# Patient Record
Sex: Male | Born: 1965 | Race: White | Hispanic: No | Marital: Single | Smoking: Former smoker
Health system: Southern US, Community
[De-identification: ages and names within clinical notes are randomized; demographics above are authoritative.]

## PROBLEM LIST (undated history)

## (undated) DIAGNOSIS — K219 Gastro-esophageal reflux disease without esophagitis: Secondary | ICD-10-CM

## (undated) DIAGNOSIS — N400 Enlarged prostate without lower urinary tract symptoms: Secondary | ICD-10-CM

## (undated) HISTORY — PX: CYSTOSCOPY WITH INSERTION OF UROLIFT: SHX6678

## (undated) HISTORY — DX: Gastro-esophageal reflux disease without esophagitis: K21.9

## (undated) HISTORY — DX: Benign prostatic hyperplasia without lower urinary tract symptoms: N40.0

## (undated) HISTORY — PX: NASAL SINUS SURGERY: SHX719

## (undated) HISTORY — PX: KNEE ARTHROSCOPY: SHX127

---

## 1984-05-01 HISTORY — PX: OTHER SURGICAL HISTORY: SHX169

## 2006-10-29 ENCOUNTER — Ambulatory Visit: Payer: Self-pay | Admitting: Internal Medicine

## 2007-02-11 ENCOUNTER — Ambulatory Visit (HOSPITAL_BASED_OUTPATIENT_CLINIC_OR_DEPARTMENT_OTHER): Admission: RE | Admit: 2007-02-11 | Discharge: 2007-02-11 | Payer: Self-pay | Admitting: Orthopedic Surgery

## 2007-07-22 DIAGNOSIS — B359 Dermatophytosis, unspecified: Secondary | ICD-10-CM | POA: Insufficient documentation

## 2007-08-15 DIAGNOSIS — F411 Generalized anxiety disorder: Secondary | ICD-10-CM | POA: Insufficient documentation

## 2007-08-30 DIAGNOSIS — K649 Unspecified hemorrhoids: Secondary | ICD-10-CM | POA: Insufficient documentation

## 2007-08-30 DIAGNOSIS — K449 Diaphragmatic hernia without obstruction or gangrene: Secondary | ICD-10-CM | POA: Insufficient documentation

## 2007-08-30 DIAGNOSIS — K219 Gastro-esophageal reflux disease without esophagitis: Secondary | ICD-10-CM | POA: Insufficient documentation

## 2008-05-01 HISTORY — PX: BLADDER SURGERY: SHX569

## 2008-06-11 ENCOUNTER — Ambulatory Visit (HOSPITAL_COMMUNITY): Admission: RE | Admit: 2008-06-11 | Discharge: 2008-06-12 | Payer: Self-pay | Admitting: Orthopedic Surgery

## 2008-07-27 ENCOUNTER — Encounter: Admission: RE | Admit: 2008-07-27 | Discharge: 2008-07-27 | Payer: Self-pay | Admitting: Orthopedic Surgery

## 2008-09-07 ENCOUNTER — Encounter: Admission: RE | Admit: 2008-09-07 | Discharge: 2008-09-07 | Payer: Self-pay | Admitting: Orthopedic Surgery

## 2008-11-16 ENCOUNTER — Encounter: Admission: RE | Admit: 2008-11-16 | Discharge: 2008-11-16 | Payer: Self-pay | Admitting: Orthopedic Surgery

## 2009-01-18 ENCOUNTER — Encounter: Admission: RE | Admit: 2009-01-18 | Discharge: 2009-01-18 | Payer: Self-pay | Admitting: Orthopedic Surgery

## 2009-06-07 ENCOUNTER — Encounter: Admission: RE | Admit: 2009-06-07 | Discharge: 2009-06-07 | Payer: Self-pay | Admitting: Orthopedic Surgery

## 2009-07-02 ENCOUNTER — Encounter: Payer: Self-pay | Admitting: Pulmonary Disease

## 2009-07-20 ENCOUNTER — Ambulatory Visit: Payer: Self-pay | Admitting: Pulmonary Disease

## 2009-07-20 DIAGNOSIS — J309 Allergic rhinitis, unspecified: Secondary | ICD-10-CM

## 2009-07-20 DIAGNOSIS — R0789 Other chest pain: Secondary | ICD-10-CM | POA: Insufficient documentation

## 2009-07-20 DIAGNOSIS — J3089 Other allergic rhinitis: Secondary | ICD-10-CM | POA: Insufficient documentation

## 2009-07-20 DIAGNOSIS — R0602 Shortness of breath: Secondary | ICD-10-CM | POA: Insufficient documentation

## 2009-07-29 ENCOUNTER — Ambulatory Visit: Payer: Self-pay | Admitting: Pulmonary Disease

## 2009-08-03 ENCOUNTER — Telehealth (INDEPENDENT_AMBULATORY_CARE_PROVIDER_SITE_OTHER): Payer: Self-pay | Admitting: *Deleted

## 2009-08-06 ENCOUNTER — Ambulatory Visit: Payer: Self-pay | Admitting: Pulmonary Disease

## 2009-08-17 ENCOUNTER — Telehealth (INDEPENDENT_AMBULATORY_CARE_PROVIDER_SITE_OTHER): Payer: Self-pay | Admitting: *Deleted

## 2009-09-07 ENCOUNTER — Telehealth: Payer: Self-pay | Admitting: Pulmonary Disease

## 2010-06-01 NOTE — Progress Notes (Signed)
Summary: Records request from the [facility]  Request for records received from the [facility]. Request forwarded to [facility]. Wilder Glade  August 17, 2009 3:37 PM

## 2010-06-01 NOTE — Miscellaneous (Signed)
Summary: Orders Update pft charges  Clinical Lists Changes  Orders: Added new Service order of Carbon Monoxide diffusing w/capacity (94720) - Signed Added new Service order of Lung Volumes (94240) - Signed Added new Service order of Spirometry (Pre & Post) (94060) - Signed 

## 2010-06-01 NOTE — Assessment & Plan Note (Signed)
Summary: ov to discuss PFT results/mg   Copy to:  Sigmund Hazel Primary Provider/Referring Provider:  Sigmund Hazel  CC:  Pt is here for a f/u appt to discuss PFT results.  Pt states he hasn't noticed changes in tightness in chest since increasing Prilosec to bid. Marland Kitchen  History of Present Illness: the pt comes in today for review of his pfts.  His spirometry shows minimal airflow obstruction, lung volumes are normal, and dlco is normal.  I have gone over the study in detail with the pt, and answered all of his questions.  Current Medications (verified): 1)  Prilosec 40 Mg Cpdr (Omeprazole) .... Take 1 Tablet By Mouth Two Times A Day 2)  Xalatan 0.005 % Soln (Latanoprost) .Marland Kitchen.. 1 Drop in Each Eye At Bedtime  Allergies (verified): 1)  ! Penicillin 2)  ! * Tetanus  Vital Signs:  Patient profile:   45 year old male Height:      72 inches Weight:      198.25 pounds BMI:     26.98 O2 Sat:      97 % on Room air Temp:     98.2 degrees F oral Pulse rate:   106 / minute BP sitting:   122 / 78  (left arm) Cuff size:   regular  Vitals Entered By: Arman Filter LPN (August 06, 1608 1:26 PM)  O2 Flow:  Room air CC: Pt is here for a f/u appt to discuss PFT results.  Pt states he hasn't noticed changes in tightness in chest since increasing Prilosec to bid.  Comments Medications reviewed with patient Arman Filter LPN  August 07, 9602 1:26 PM    Physical Exam  General:  wd male in nad   Impression & Recommendations:  Problem # 1:  DYSPNEA (ICD-786.05)  the pt has continued to have sob and chest tightness, and his pfts are really not overly impressive.  His FEV1% is minimally decreased, and may be indicative of occult asthma or RAD related to his job exposures.  What I do not know is if this translates into his SYMPTOMS of chest pressure and dyspnea.  I would like to try him on a bronchodilator/ICS combo for the next 3 weeks, and see if he has improvement.  He will call me with his progress.   Time spent with pt today was .  Medications Added to Medication List This Visit: 1)  Prilosec 40 Mg Cpdr (Omeprazole) .... Take 1 tablet by mouth two times a day  Other Orders: Est. Patient Level III (54098)  Patient Instructions: 1)  trial of symbicort 160/4.5  2 inhalations am and pm each day for next 3 weeks.  rinse mouth well after using 2)  call me in 3 weeks with response to therapy.

## 2010-06-01 NOTE — Progress Notes (Signed)
Summary: fyi  Phone Note Call from Patient   Caller: Patient Call For: clance Summary of Call: pt calling to report that symbicort isn't helping very much Initial call taken by: Rickard Patience,  Sep 07, 2009 2:39 PM  Follow-up for Phone Call        Spoke with Edward Nelson has been using symbicort 2 puffs twice a day for a month he was given two samples, and has approx 4 days left, fyi for you that it did not help. Also wanted you to know that he no longer has insurance Armita [employer], LPN  Sep 07, 2009 2:47 PM   Additional Follow-up for Phone Call Additional follow up Details #1::        a lack of response to therapy means that his very mild abnormality on breathing tests have nothing to do with his symptoms of chest pressure and sob.  I can find nothing pulmonary to explain his symptoms.  ok to stop all inhalers. Additional Follow-up by: Barbaraann Share MD,  Sep 08, 2009 8:38 PM    Additional Follow-up for Phone Call Additional follow up Details #2::    pt advised. Carron Curie CMA  Sep 09, 2009 9:16 AM

## 2010-06-01 NOTE — Assessment & Plan Note (Signed)
Summary: consult for dyspnea and chest discomfort   Copy to:  Sigmund Hazel Primary Provider/Referring Provider:  Sigmund Hazel  CC:  Pulmonary Consult.  History of Present Illness: The pt is a 45y/o male who I have been asked to see for atypical chest pain.  The pt feels that he has tightness in his chest at all times, and this has been ongoing for several months.  He describes the discomfort as a pressure-like sensation, and also notes abnormal sob.  He has doe at 2 blocks on flat ground at a moderate pace, and will also get winded walking up one flight of stairs.  He does note occasional cough, and can produce mucus with "black material" at times.  He works in Research scientist (physical sciences), primarily with copper, and has a lot of smoke and soot in his environment.  He has a long h/o tobacco use, but did quit 3 years ago.  He also has a h/o hiatal hernia with reflux, but is not overly symptomatic currently.  He did have a cxr recently which showed only mild scoliosis, but no acute process.  Preventive Screening-Counseling & Management  Alcohol-Tobacco     Smoking Status: quit  Current Medications (verified): 1)  Prilosec 40 Mg Cpdr (Omeprazole) .... Take 1 Tablet By Mouth Once A Day 2)  Xalatan 0.005 % Soln (Latanoprost) .Marland Kitchen.. 1 Drop in Each Eye At Bedtime  Allergies (verified): 1)  ! Penicillin 2)  ! * Tetanus  Past History:  Past Medical History:  ALLERGIC RHINITIS (ICD-477.9) HEMORRHOIDS (ICD-455.6) HIATAL HERNIA (ICD-553.3) GERD (ICD-530.81)    Past Surgical History: Hemorrhoid surgery sinus surgery June 2000 disc surgery in neck June 2010 Trans Urethral Incision of bladder  R shoulder surgery   Family History: Reviewed history and no changes required. cancer: mother (breast) father (oral)   Social History: Reviewed history and no changes required. Patient states former smoker.  started at age 74.  1 1/2 ppd.  quit 2008. pt is single. pt lives alone. pt works at [employer].    Smoking Status:  quit  Review of Systems       The patient complains of shortness of breath with activity, chest pain, acid heartburn, indigestion, nasal congestion/difficulty breathing through nose, and anxiety.  The patient denies shortness of breath at rest, productive cough, non-productive cough, coughing up blood, irregular heartbeats, loss of appetite, weight change, abdominal pain, difficulty swallowing, sore throat, tooth/dental problems, headaches, sneezing, itching, ear ache, depression, hand/feet swelling, joint stiffness or pain, rash, change in color of mucus, and fever.    Vital Signs:  Patient profile:   45 year old male Height:      72 inches Weight:      194.50 pounds BMI:     26.47 O2 Sat:      97 % on Room air Temp:     98.2 degrees F oral Pulse rate:   71 / minute BP sitting:   120 / 82  (left arm) Cuff size:   regular  Vitals Entered By: Arman Filter LPN (July 20, 2009 10:18 AM)  O2 Flow:  Room air CC: Pulmonary Consult Comments Medications reviewed with patient Arman Filter LPN  July 20, 2009 10:18 AM    Physical Exam  General:  wd male in nad Eyes:  PERRLA and EOMI.   Nose:  mild deviation of septum to left with narrowing Mouth:  moderate elongation of soft palate and uvula. Neck:  no jvd, tmg, LN Lungs:  clear to auscultation, no  wheezing or rhonchi Heart:  rrr, no mrg Abdomen:  soft, NT, BS+ Extremities:  no edema or cyanosis pulses intact distally Neurologic:  alert and oriented, moves all 4.   Impression & Recommendations:  Problem # 1:  DYSPNEA (ICD-786.05) It is unclear if the patient has a pulmonary issue that is causing both his sob and chest tightness.  He does have a h/o tobacco abuse and works in a Advertising copywriter, and therefore may have airways disease.  I think he needs to have full pfts for evaluation.   Problem # 2:  CHEST DISCOMFORT (ICD-786.59) Unclear if discomfort is due to airways disease, or if this may simply be a  manifestation of his reflux.  There is nothing to suggest cardiac disease since it is unrelenting, and has been going on for months.  It is not worsened with activity.  Medications Added to Medication List This Visit: 1)  Prilosec 40 Mg Cpdr (Omeprazole) .... Take 1 tablet by mouth once a day 2)  Xalatan 0.005 % Soln (Latanoprost) .Marland Kitchen.. 1 drop in each eye at bedtime  Other Orders: Consultation Level IV (04540) Pulmonary Referral (Pulmonary)  Patient Instructions: 1)  will schedule for breathing tests to evaluate for possible airways disease.  I will call you with results. 2)  would take reflux med am and pm for 3-4 weeks to see if chest tightness gets better.

## 2010-06-01 NOTE — Progress Notes (Signed)
Summary: results  Phone Note Call from Patient   Caller: Patient Call For: clance Summary of Call: results of pft Initial call taken by: Rickard Patience,  August 03, 2009 2:39 PM  Follow-up for Phone Call        pt calling for PFT results done 07-29-2009. pt aware kc out of the office today.   please call pt at [phone].  thanks.  Aundra Millet Reynolds LPN  August 04, 4538 3:23 PM   Additional Follow-up for Phone Call Additional follow up Details #1::        pt needs ov to go over pfts and discuss possible treatment. Additional Follow-up by: Barbaraann Share MD,  August 04, 2009 5:25 PM    Additional Follow-up for Phone Call Additional follow up Details #2::    called and spoke with pt.  pt scheduled to see [facility] friday 08-06-2009 at 1:30pm.  Arman Filter LPN  August 04, 9809 5:32 PM

## 2010-08-16 LAB — CBC
HCT: 45.4 % (ref 39.0–52.0)
Hemoglobin: 15.6 g/dL (ref 13.0–17.0)
MCV: 89.3 fL (ref 78.0–100.0)
WBC: 5.6 10*3/uL (ref 4.0–10.5)

## 2010-09-13 NOTE — Op Note (Signed)
NAME:  LADAINIAN, THERIEN                ACCOUNT NO.:  [id number]   MEDICAL RECORD NO.:  [id number]          PATIENT TYPE:  AMB   LOCATION:  DSC                          FACILITY:  [facility]   PHYSICIAN:  Feliberto Gottron. Turner Daniels, M.D.   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   PROCEDURE:  Right shoulder arthroscopic anterior-inferior acromioplasty,  formal distal clavicle excision.   SURGEON:  Feliberto Gottron. Turner Daniels, M.D.   FIRST ASSISTANT:  None.   ANESTHETIC:  Interscalene block with general endotracheal.   ESTIMATED BLOOD LOSS:  Minimal.   FLUID REPLACEMENT:  800 mL of crystalloid.   DRAINS PLACED:  None.   TOURNIQUET TIME:  None.   INDICATIONS FOR PROCEDURE:  The patient is a 45 year old man injured on  the job with MRI-proven AC joint arthritis confirmed by x-ray,  subacromial impingement with an intact rotator cuff.  He failed  conservative treatment with anti-inflammatory medicines, physical  therapy, got good temporary response to cortisone injections and is now  taken for arthroscopic evaluation and treatment of his shoulder.  Risks  and benefits of surgery were discussed with the patient and questions  answered.   DESCRIPTION OF PROCEDURE:  The patient was identified by armband and  underwent interscalene block anesthesia in the block area at [facility]  day surgery center.  He was then taken the operating room 1 where the  appropriate anesthetic monitors were reattached and general endotracheal  anesthesia was induced.  He was then placed in the beach-chair position,  right upper extremity prepped and draped in usual sterile fashion from  the wrist to the hemithorax, and we began the procedure  by making  standard portals 1.5-cm anterior to the [facility] joint lateral to the junction  of middle and posterior thirds of acromion, posterior to the  posterolateral corner of the acromion process.  The inflow was placed  anteriorly, the arthroscope laterally and a 4.2 [place]  shaver posteriorly allowing subacromial bursectomy and outlining of the  subacromial spur which was then removed with a 4.5 hooded vortex bur  creating a flat type 1 acromion.  At this point, the [facility] joint was  evaluated.  It was down to bare-bone arthritic changes and the distal  inferior 1 cm of the clavicle removed from this approach.  We then  swapped portals bringing the arthroscope posteriorly, the inflow  laterally and the bur anteriorly completing the distal clavicle  excision, and photographic documentation was made of the acromioplasty  and the distal clavicle excision as well as the intact although inflamed  rotator cuff.  The arthroscope was then repositioned into the  glenohumeral joint using a posterior portal where diagnostic arthroscopy  revealed a normal glenohumeral joint, normal biceps anchor rotator cuff  insertion, although there was some inflammation along the supraspinatus  insertion.  At this point, the shoulder  was irrigated out with normal  saline solution, the arthroscopic instruments removed and a dressing of  Xeroform, 4x4 dressing sponges, Hypafix tape and a sling applied.  The  patient was laid supine, awakened and taken to the recovery room without  difficulty.      Feliberto Gottron. Turner Daniels, M.D.  Electronically Signed     FJR/MEDQ  D:  02/11/2007  T:  02/12/2007  Job:  [id number]

## 2010-09-13 NOTE — Assessment & Plan Note (Signed)
[facility]                         GASTROENTEROLOGY OFFICE NOTE   NAME:Bumgarner, Pasha                         MRN:          [id number]  DATE:10/29/2006                            DOB:          [birth year]    Mr. Christopoulos is a 45 year old gentleman who is here today to evaluate his  gastroesophageal reflux.  He has an almost constant sour taste in his  mouth when he comes home from work in the mornings.  He has had a hiatal  hernia and reflux for at least five years.  His evaluation took place in  [place] in [place] by Dr. Bishop Limbo.  He underwent an upper  endoscopy and colonoscopy in 2003.  We have requested the records.  His  endoscopy showed hiatal hernia and colonoscopy, possibly a polyp.  He  tried Nexium and Protonix.  None of them were a success.  He is  currently on Prevacid 30 mg in the morning and Prilosec 20 mg at  bedtime.  He never takes both of those medications the same day.  He  either takes the Prevacid or the Prilosec.  He works third shift and has  a very irregular schedule.  The work starts at 7 p.m., and he gets home  at 7 a.m. for two days, then he is two days off.  Then he works again  and is again off for three days.  His eating habits as well as sleeping  habits are changed constantly.  His weight has increased since he  stopped smoking six months ago.  He has a history of hemorrhoids,  intermittent bright red blood per rectum.  He denies dysphagia or  odynophagia.  There is a family history of colon polyps in his mother.   MEDICATIONS:  1. Prilosec OTC.  2. Prevacid Solutabs 30 mg p.r.n.   PAST MEDICAL HISTORY:  Anxiety.  Hemorrhoid surgery.   FAMILY HISTORY:  Positive for colon polyps, breast cancer in mother.   SOCIAL HISTORY:  Single.  High school education. Works as a Radio broadcast assistant.  He does not smoke anymore, he drinks occasionally.   REVIEW OF SYSTEMS:  Vitals stable.  Sinus problems.  Excessive thirst.  Hearing problems.   PHYSICAL EXAMINATION:  VITAL SIGNS:  Blood pressure 114/76, pulse 80.  Weight 181 pounds.  GENERAL:  He is alert, oriented, in no distress.  SKIN:  Warm and dry.  There were no tattoos.  HEENT:  Sclerae are anicteric.  NECK:  Supple with no adenopathy.  LUNGS:  Clear to auscultation.  COR:  Normal S1 and S2.  ABDOMEN:  Soft and nontender with normoactive bowel sounds __________  costal margin.  No CVA tenderness.  RECTAL:  Normal rectal tone.  Stool is hemoccult negative.   IMPRESSION:  A 45 year old white male with longstanding gastroesophageal  reflux, only partially responsive to proton pump inhibitor.  The  contributing factor in his symptoms is his irregular schedule, working  third shift intermittently.  It impacts his eating habits as well as his  gastric emptying.  We need to rule out Barrett's esophagus.  There is  also a family history of colon polyps which needs to be further followed  with another colonoscopy.  Patient was asking about the possibility of  Nissen fundoplication.  We have discussed the surgery as a possible  option if he does not respond to medical treatment.   PLAN:  1. Obtain records from Dr. Orlan Leavens from [place] with reports of      upper endoscopy and colonoscopy.  2. Start on a regular basis Prilosec 40 mg twice daily.  3. Antireflux measures.  4. Based on the results and review of the records, we will probably      likely need another upper endoscopy with biopsies as well as      colonoscopy.  He will follow antireflux measures as much as he can      with regards to his work schedule.  He will return in 6-8 weeks.     Hedwig Morton. Juanda Chance, MD  Electronically Signed    DMB/MedQ  DD: 10/29/2006  DT: 10/30/2006  Job #: [id number]

## 2010-09-13 NOTE — H&P (Signed)
NAME:  Edward Nelson, Edward Nelson                ACCOUNT NO.:  [id number]   MEDICAL RECORD NO.:  [id number]         PATIENT TYPE:  COIB   LOCATION:                               FACILITY:  [facility]   PHYSICIAN:  Alvy Beal, MD    DATE OF BIRTH:  Feb 18, 1966   DATE OF ADMISSION:  06/11/2008  DATE OF DISCHARGE:                              HISTORY & PHYSICAL   CHIEF COMPLAINT:  Neck and left-sided arm pain.   BRIEF HISTORY:  Edward Nelson is a very pleasant 45 year old gentleman who  is well known to our practice.  Despite physical therapy and  conservative management, he is diving significant radicular left arm  pain, numbness, and neck pain.  His strength remains reduced on the left  triceps and with the pain and dysesthesia down the left side are  significantly impairing his quality of life and also increasing his  pain.  Since the patient has a failed conservative management, Dr.  Shon Baton recommended an anterior cervical diskectomy and fusion at the C6-  7 level.  Dr. Shon Baton discussed the risks and benefits of the procedure.  The risks include infection, bleeding, nerve damage, death, stroke,  paralysis, failure to heal, need for further surgical intervention,  ongoing or worse pain, nonunion, temporary or permanent throat pain,  swallowing difficulties, and hoarseness in the voice.  Dr. Shon Baton took  the time to go over the surgical procedure and all of the patient's  questions were answered and the patient consented the procedure.   PAST MEDICAL/SURGICAL HISTORY:  The patient had a right ORIF of his  ankle in 1986.  He had a hemorrhoidectomy in 2005.  He had a TUBIN in  2007 and he had a right shoulder scope in 2008.   FAMILY HISTORY:  Father deceased at 25 of oral cancer, otherwise  negative.   SOCIAL HISTORY:  The patient currently is a nonsmoker; however, he does  have a history of using cigarettes in the past.  He occasionally enjoys  a glass of alcohol.  He has never used any street  drugs.  He currently  lives alone in a one-level apartment.   REVIEW OF SYSTEMS:  Review of systems taken today in the office.  GENERAL:  Otherwise, negative for fevers, chills, weight change, loss of  memory, night sweats, or fatigue.  HEENT:  Otherwise, negative for  headache, blurred vision, double vision, dizziness, hearing loss,  paralysis, weakness, tremor, blackout spells, insomnia, balance problem,  dentures, or ringing in the ears.  DERMATOLOGIC:  Positive for an  occasional rash that he gets under his right shoulder.  Otherwise,  negative for itching, hives, lesions, eczema, or any rashes near the  planned surgical site.  RESPIRATORY:  Otherwise, negative for shortness  of breath, cough, wheezing, or allergies.  CARDIOVASCULAR:  Otherwise,  negative for chest pains, palpitations, difficulty breathing laying  flat, swelling, and/or murmur.  GI:  Otherwise, negative for nausea,  vomiting, diarrhea, constipation, blood in stool, jaundice, heartburn,  difficulty swallowing, loss of appetite, and/or abdominal pain.  GU:  Negative for painful urination, urinary frequency, blood in  urine,  incontinence, urinary discharge, weak stream, urinating at night, flank  pain, urgency, or urinary retention.  MUSCULOSKELETAL:  Positive for  painful neck range of motion and positive for numbness in the left hand,  otherwise negative for any joint swelling, back pain, spasms, or morning  stiffness.   PHYSICAL EXAMINATION:  GENERAL:  The patient is alert and oriented x3,  very healthy, 45 year old gentleman in no acute distress.  Affect is  appropriate.  VITAL SIGNS:  Today in office included a blood pressure of 120/70, a  pulse of 72, and respiratory rate of 16.  SKIN:  Negative for any rashes or bruises.  HEENT:  Head was atraumatic and normocephalic.  Eyes, PERRLA.  Throat  was supple with no lymphadenopathy.  NECK:  No neck masses were noted.  Again, has some decreased range of  motion.  No  trachea deviation.  HEART:  Regular rate and rhythm.  No murmurs, rubs, or gallops.  LUNGS:  Clear to auscultation.  No wheezes, crackles, or vocal fremitus  was noted.  ABDOMEN:  Soft and nontender.  No rebound tenderness, masses, or  guarding.  No CVA tenderness.  MUSCULOSKELETAL:  Again, the patient had some weakness in the left  triceps muscle region.  Grip strength is approximately 5/5 compared  bilaterally.  He had a decreased sensation on the left extremity  compared to the right upper extremity.   ASSESSMENT/PLAN:  The patient is expected to undergo the planned  procedure of an anterior cervical diskectomy and fusion at C6-7 level by  Dr. Venita Lick on June 11, 2008.      Crissie Reese, PA      Alvy Beal, MD  Electronically Signed    AC/MEDQ  D:  06/04/2008  T:  06/05/2008  Job:  [id number]   cc:   [facility]

## 2010-09-13 NOTE — Op Note (Signed)
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   OPERATIVE PROCEDURE:  Anterior cervical diskectomy and fusion at C6-7  with an 8-mm fibular precut MTF graft packed with Actifuse and a 16-mm  anterior cervical plate affixed with 16-mm locking screws.   COMPLICATIONS:  None.   CONDITION:  Stable.   HISTORY:  This is a very pleasant 45 year old gentleman who has had long-  standing significant neck and right arm pain.  The patient states that  the pain and weakness are quite severe.  Despite appropriate  conservative management, he failed to improve and so we elected to  proceed with surgery.  All appropriate risks, benefits and alternatives  were discussed with the patient and consent was obtained.   OPERATIVE NOTE:  The patient was brought to the operating room and  placed supine on the operating table.  After successful induction of  general anesthesia and endotracheal intubation, TED and SCDs were  applied.  Rolled towels were placed between the shoulder blades and the  shoulders were taped down.  The anterior cervical spine was prepped and  draped in a standard fashion.   A standard left-sided Smith-Robinson approach to the cervical spine was  taken.  A longitudinal incision was made just at the level of the  cricoid cartilage and sharp dissection was carried out down to and  through the platysma.  I then sharply dissected through the deep  cervical fascia and I then was able to sweep the trachea and esophagus  medially and protected with a finger.  I visualized and protected the  carotid sheath with fingers underneath on the lateral side and I was  able to visualize the anterior cervical spine.  I then used a Forensic scientist to mobilize the prevertebral fascia.  I then placed a needle  into the C6-7 disk space, took an x-ray and confirmed that I was at the  appropriate level.  Once this was confirmed, I used a double-action  Leksell rongeur to remove the anterior osteophytes at the C6-7 level.  I  then placed self-retaining retractors blades (Caspar into the wounds,  deflated the endotracheal cuff, and ensured that the blades themselves  were underneath the longus colli muscle).  I then opened them up and  then inflated the endotracheal cuff.  At this point, I had excellent  visualization of the disk space and anterior cervical spine.  Distraction pins were placed into the C6-C7 disk vertebral bodies and  then began the diskectomy.   Using a 15 blade scalpel, I incised the annulus and then used a  pituitary rongeur, curettes,  and Kerrison rongeurs to resect the entire  disk material.  There were two small fragments of disk material that had  herniated into the posterolateral gutter on the left side which I could  freely remove using a nerve hook and micro pituitary rongeur.  I then  developed a plane using a fine curette between the underlying posterior  longitudinal ligament and the thecal sac.  I then used a 1-mm Kerrison  to resect the entire posterior longitudinal ligament.  I then resected  the underlying bone spurs at the uncovertebral joints and I swept the  left lateral gutter posterolaterally on the left to ensure that I had  removed all the fragments of disk material.  At this point, I started  with the diskectomy.  I used a rasp to decorticate the endplates and  then hydrated with an 8-mm lordotic MT fibular strut.  I then packed it  with Actifuse and malleted it to appropriate  depth.  I then placed a 16-  mm anterior cervical plate and affixed it with 16-mm locking screws.  X-  rays were satisfactory.  I then removed the remaining retractors and  rechecked to ensure that the esophagus did not become entrapped  underneath the plate.  I then returned the trachea and esophagus to  midline, irrigated copiously with normal saline, obtained hemostasis  using bipolar electrocautery and maintained it with FloSeal and then  closed the platysma with interrupted 2-0 Vicryl sutures and the skin  with 3-0 Monocryl.  Steri-Strips, dry dressing and Aspen collar were  applied.  The patient was extubated and transferred to the PACU without  incident .  At the end of the case, all needle and sponge counts were  correct.   FIRST ASSISTANT:  Crissie Reese, PA      Alvy Beal, MD  Electronically Signed     DDB/MEDQ  D:  06/11/2008  T:  06/12/2008  Job:  [id number]

## 2011-05-02 HISTORY — PX: CERVICAL DISC SURGERY: SHX588

## 2015-08-20 DIAGNOSIS — N411 Chronic prostatitis: Secondary | ICD-10-CM | POA: Insufficient documentation

## 2015-08-20 DIAGNOSIS — N529 Male erectile dysfunction, unspecified: Secondary | ICD-10-CM | POA: Insufficient documentation

## 2018-01-30 DIAGNOSIS — N410 Acute prostatitis: Secondary | ICD-10-CM | POA: Diagnosis not present

## 2018-01-30 DIAGNOSIS — R3914 Feeling of incomplete bladder emptying: Secondary | ICD-10-CM | POA: Diagnosis not present

## 2018-01-30 DIAGNOSIS — N401 Enlarged prostate with lower urinary tract symptoms: Secondary | ICD-10-CM | POA: Diagnosis not present

## 2018-01-30 DIAGNOSIS — R300 Dysuria: Secondary | ICD-10-CM | POA: Diagnosis not present

## 2018-03-12 DIAGNOSIS — R079 Chest pain, unspecified: Secondary | ICD-10-CM | POA: Diagnosis not present

## 2018-03-12 DIAGNOSIS — Z981 Arthrodesis status: Secondary | ICD-10-CM | POA: Diagnosis not present

## 2018-03-12 DIAGNOSIS — J309 Allergic rhinitis, unspecified: Secondary | ICD-10-CM | POA: Diagnosis not present

## 2018-03-12 DIAGNOSIS — L720 Epidermal cyst: Secondary | ICD-10-CM | POA: Diagnosis not present

## 2018-03-12 DIAGNOSIS — R600 Localized edema: Secondary | ICD-10-CM | POA: Diagnosis not present

## 2018-03-12 DIAGNOSIS — R0781 Pleurodynia: Secondary | ICD-10-CM | POA: Diagnosis not present

## 2018-03-14 DIAGNOSIS — N401 Enlarged prostate with lower urinary tract symptoms: Secondary | ICD-10-CM | POA: Diagnosis not present

## 2018-03-14 DIAGNOSIS — R339 Retention of urine, unspecified: Secondary | ICD-10-CM | POA: Diagnosis not present

## 2018-03-14 DIAGNOSIS — R3914 Feeling of incomplete bladder emptying: Secondary | ICD-10-CM | POA: Diagnosis not present

## 2018-04-04 DIAGNOSIS — L309 Dermatitis, unspecified: Secondary | ICD-10-CM | POA: Diagnosis not present

## 2018-04-04 DIAGNOSIS — K219 Gastro-esophageal reflux disease without esophagitis: Secondary | ICD-10-CM | POA: Diagnosis not present

## 2018-04-26 DIAGNOSIS — M2342 Loose body in knee, left knee: Secondary | ICD-10-CM | POA: Insufficient documentation

## 2018-05-21 DIAGNOSIS — L720 Epidermal cyst: Secondary | ICD-10-CM | POA: Diagnosis not present

## 2018-05-21 DIAGNOSIS — L723 Sebaceous cyst: Secondary | ICD-10-CM | POA: Diagnosis not present

## 2018-05-21 DIAGNOSIS — L309 Dermatitis, unspecified: Secondary | ICD-10-CM | POA: Diagnosis not present

## 2018-06-05 DIAGNOSIS — I2129 ST elevation (STEMI) myocardial infarction involving other sites: Secondary | ICD-10-CM | POA: Diagnosis not present

## 2018-06-05 DIAGNOSIS — R079 Chest pain, unspecified: Secondary | ICD-10-CM | POA: Diagnosis not present

## 2018-07-17 DIAGNOSIS — L309 Dermatitis, unspecified: Secondary | ICD-10-CM | POA: Diagnosis not present

## 2018-07-17 DIAGNOSIS — L821 Other seborrheic keratosis: Secondary | ICD-10-CM | POA: Diagnosis not present

## 2018-07-17 DIAGNOSIS — T148XXA Other injury of unspecified body region, initial encounter: Secondary | ICD-10-CM | POA: Diagnosis not present

## 2018-07-17 DIAGNOSIS — I872 Venous insufficiency (chronic) (peripheral): Secondary | ICD-10-CM | POA: Diagnosis not present

## 2018-07-31 DIAGNOSIS — L989 Disorder of the skin and subcutaneous tissue, unspecified: Secondary | ICD-10-CM | POA: Diagnosis not present

## 2018-07-31 DIAGNOSIS — L720 Epidermal cyst: Secondary | ICD-10-CM | POA: Diagnosis not present

## 2018-07-31 DIAGNOSIS — L723 Sebaceous cyst: Secondary | ICD-10-CM | POA: Diagnosis not present

## 2019-05-15 ENCOUNTER — Other Ambulatory Visit: Payer: Self-pay

## 2019-05-16 ENCOUNTER — Ambulatory Visit (INDEPENDENT_AMBULATORY_CARE_PROVIDER_SITE_OTHER): Payer: BC Managed Care – PPO | Admitting: Family Medicine

## 2019-05-16 ENCOUNTER — Encounter: Payer: Self-pay | Admitting: Family Medicine

## 2019-05-16 VITALS — BP 124/80 | HR 93 | Temp 98.5°F | Ht 72.0 in | Wt 200.9 lb

## 2019-05-16 DIAGNOSIS — Z13220 Encounter for screening for lipoid disorders: Secondary | ICD-10-CM | POA: Diagnosis not present

## 2019-05-16 DIAGNOSIS — K219 Gastro-esophageal reflux disease without esophagitis: Secondary | ICD-10-CM | POA: Diagnosis not present

## 2019-05-16 DIAGNOSIS — J309 Allergic rhinitis, unspecified: Secondary | ICD-10-CM

## 2019-05-16 DIAGNOSIS — K635 Polyp of colon: Secondary | ICD-10-CM | POA: Diagnosis not present

## 2019-05-16 DIAGNOSIS — Z131 Encounter for screening for diabetes mellitus: Secondary | ICD-10-CM

## 2019-05-16 LAB — COMPREHENSIVE METABOLIC PANEL
ALT: 19 U/L (ref 0–53)
AST: 16 U/L (ref 0–37)
Albumin: 4.3 g/dL (ref 3.5–5.2)
Alkaline Phosphatase: 76 U/L (ref 39–117)
BUN: 11 mg/dL (ref 6–23)
CO2: 27 mEq/L (ref 19–32)
Calcium: 9.6 mg/dL (ref 8.4–10.5)
Chloride: 104 mEq/L (ref 96–112)
Creatinine, Ser: 0.98 mg/dL (ref 0.40–1.50)
GFR: 79.34 mL/min (ref >60.00)
Glucose, Bld: 91 mg/dL (ref 70–99)
Potassium: 4 mEq/L (ref 3.5–5.1)
Sodium: 139 mEq/L (ref 135–145)
Total Bilirubin: 0.5 mg/dL (ref 0.2–1.2)
Total Protein: 6.5 g/dL (ref 6.0–8.3)

## 2019-05-16 LAB — CBC WITH DIFFERENTIAL/PLATELET
Basophils Absolute: 0 10*3/uL (ref 0.0–0.1)
Basophils Relative: 0.6 % (ref 0.0–3.0)
Eosinophils Absolute: 0.2 10*3/uL (ref 0.0–0.7)
Eosinophils Relative: 2.4 % (ref 0.0–5.0)
HCT: 49.6 % (ref 39.0–52.0)
Hemoglobin: 16.6 g/dL (ref 13.0–17.0)
Lymphocytes Relative: 28.4 % (ref 12.0–46.0)
Lymphs Abs: 2.1 10*3/uL (ref 0.7–4.0)
MCHC: 33.6 g/dL (ref 30.0–36.0)
MCV: 91.3 fl (ref 78.0–100.0)
Monocytes Absolute: 0.7 10*3/uL (ref 0.1–1.0)
Monocytes Relative: 9.8 % (ref 3.0–12.0)
Neutro Abs: 4.4 10*3/uL (ref 1.4–7.7)
Neutrophils Relative %: 58.8 % (ref 43.0–77.0)
Platelets: 164 10*3/uL (ref 150.0–400.0)
RBC: 5.43 Mil/uL (ref 4.22–5.81)
RDW: 13.6 % (ref 11.5–15.5)
WBC: 7.5 10*3/uL (ref 4.0–10.5)

## 2019-05-16 LAB — LIPID PANEL
Cholesterol: 181 mg/dL (ref 0–200)
HDL: 41.3 mg/dL (ref >39.00)
LDL Cholesterol: 108 mg/dL — ABNORMAL HIGH (ref 0–99)
NonHDL: 140.06
Total CHOL/HDL Ratio: 4
Triglycerides: 158 mg/dL — ABNORMAL HIGH (ref 0.0–149.0)
VLDL: 31.6 mg/dL (ref 0.0–40.0)

## 2019-05-16 LAB — HEMOGLOBIN A1C: Hgb A1c MFr Bld: 5.5 % (ref 4.6–6.5)

## 2019-05-16 MED ORDER — FLUTICASONE PROPIONATE 50 MCG/ACT NA SUSP: 2.0000 | Freq: Every day | NASAL | 6 refills | Status: DC

## 2019-05-16 NOTE — Progress Notes (Signed)
Edward Nelson DOB: Jun 28, 1965 Encounter date: 05/16/2019  This is a 55 y.o. male who presents to establish care. Chief Complaint  Patient presents with  . Establish Care    History of present illness: Was seeing provider through [facility] Marland KitchenBayard Beaver) and wanted to see MD.   Sinuses bothered him for years. Has had sinus surgery in past. Had left side operated on in past and then had some bleeding on right side. Does have issues at night with breathing. Was taking tylenol cold medication; now using equate tylenol, phenylephrine, guaifenesin. Just has a lot of drainage, eyes watering, drainage at night. Has nasal spray which he uses every once in awhile. Doesn't like taste of nasal spray; tends to go down throat. Has tried allegra, zyrtec, singulair. Currently not on antihistamine. Didn't feel like surgery helped with breathing on left side. Pretty much always has some sort of issue. Cough drop helps bring up phlegm to help with swallowing.  Does snore at night. Works night shift (just switched back). Tries to get 7-8 hours/night.   Acid reflux - has had this for years. Everyone in family also has this. Omeprazole does help with symptoms.   Had colonoscopy in 2006 in [place]; had some polyps. Due for repeat. Would like to wait until COVID is better.   Occasional neck pain - hx of replacement in 2013.   Past Medical History:  Diagnosis Date  . Enlarged prostate    treated by urologist with [facility]  . GERD (gastroesophageal reflux disease)    Past Surgical History:  Procedure Laterality Date  . BLADDER SURGERY    . CERVICAL DISC SURGERY    . KNEE ARTHROSCOPY Left   . NASAL SINUS SURGERY Left    started bleeding so didn't complete right side   Allergies  Allergen Reactions  . Penicillins   . Tetanus Toxoid    Current Meds  Medication Sig  . Multiple Vitamin (MULTIVITAMIN) capsule Take 1 capsule by mouth daily.  Marland Kitchen omeprazole (PRILOSEC) 20 MG capsule TAKE ONE  CAPSULE BY MOUTH ONCE DAILY  . tadalafil (CIALIS) 5 MG tablet TAKE ONE TABLET BY MOUTH ONCE DAILY   Social History   Tobacco Use  . Smoking status: Former Smoker    Packs/day: 1.00    Years: 35.00    Pack years: 35.00    Types: Cigarettes    Start date: 20    Quit date: 2015    Years since quitting: 6.0  . Smokeless tobacco: Never Used  Substance Use Topics  . Alcohol use: Not on file   Family History  Problem Relation Age of Onset  . Breast cancer Mother   . Osteoporosis Mother   . Hypertension Mother   . Arthritis Mother   . Cancer - Other Father        oral  . Breast cancer Sister   . Hypertension Sister   . Other Brother        acid reflux     Review of Systems  Constitutional: Negative for chills, fatigue and fever.  HENT: Positive for congestion and postnasal drip.   Respiratory: Negative for cough, chest tightness, shortness of breath and wheezing.   Cardiovascular: Negative for chest pain, palpitations and leg swelling.    Objective:  BP 124/80 (BP Location: Right Arm, Patient Position: Sitting, Cuff Size: Large)   Pulse 93   Temp 98.5 F (36.9 C) (Temporal)   Ht 6' (1.829 m)   Wt 200 lb 14.4 oz (  91.1 kg)   SpO2 98%   BMI 27.25 kg/m   Weight: 200 lb 14.4 oz (91.1 kg)   BP Readings from Last 3 Encounters:  05/16/19 124/80   Wt Readings from Last 3 Encounters:  05/16/19 200 lb 14.4 oz (91.1 kg)    Physical Exam Constitutional:      General: He is not in acute distress.    Appearance: He is well-developed.  HENT:     Right Ear: Tympanic membrane and ear canal normal.     Left Ear: Tympanic membrane and ear canal normal.     Nose: Septal deviation present.     Right Turbinates: Enlarged and swollen.     Left Turbinates: Enlarged.     Comments: There is significant turbinate and mucosal edema and erythema on the right.  Left is also erythematous, with loss of edema. Cardiovascular:     Rate and Rhythm: Normal rate and regular rhythm.      Heart sounds: Normal heart sounds. No murmur. No friction rub.  Pulmonary:     Effort: Pulmonary effort is normal. No respiratory distress.     Breath sounds: Normal breath sounds. No wheezing or rales.  Musculoskeletal:     Right lower leg: No edema.     Left lower leg: No edema.  Neurological:     Mental Status: He is alert and oriented to person, place, and time.  Psychiatric:        Behavior: Behavior normal.     Assessment/Plan: 1. Gastroesophageal reflux disease without esophagitis History of reflux that is well controlled with omeprazole. - Ambulatory referral to Gastroenterology  2. Polyp of colon, unspecified part of colon, unspecified type History of polyp in the colon overdue for repeat colonoscopy.  3. Allergic rhinitis, unspecified seasonality, unspecified trigger Willing to visit with allergist to see if there is something that can be done from an allergy standpoint to help with symptoms.  Encouraged him in the meanwhile to try an antihistamine daily as well as Flonase daily. - fluticasone (FLONASE) 50 MCG/ACT nasal spray; Place 2 sprays into both nostrils daily.  Dispense: 16 g; Refill: 6 - Ambulatory referral to Allergy - CBC with Differential/Platelet; Future - CBC with Differential/Platelet  4. Lipid screening - Lipid panel; Future - Lipid panel  5. Screening for diabetes mellitus - Comprehensive metabolic panel; Future - Hemoglobin A1c; Future - Hemoglobin A1c - Comprehensive metabolic panel  Return for pending labs. Can schedule physical in the near future. Micheline Rough, MD

## 2019-05-19 ENCOUNTER — Encounter: Payer: Self-pay | Admitting: Family Medicine

## 2019-05-19 ENCOUNTER — Encounter: Payer: Self-pay | Admitting: Gastroenterology

## 2019-05-20 ENCOUNTER — Encounter: Payer: Self-pay | Admitting: Family Medicine

## 2019-05-21 NOTE — Telephone Encounter (Signed)
Okay to put in any 30-minute time slot available.

## 2019-05-22 NOTE — Telephone Encounter (Signed)
Spoke with the pt and informed him an appt will be scheduled for 2/22 to arrive at 10:15am for a physical.  Message sent to [facility] as this is blocked as a same day slot.

## 2019-06-16 ENCOUNTER — Encounter: Payer: Self-pay | Admitting: Allergy

## 2019-06-16 ENCOUNTER — Ambulatory Visit: Payer: BC Managed Care – PPO | Admitting: Allergy

## 2019-06-16 ENCOUNTER — Ambulatory Visit: Payer: Self-pay | Admitting: Allergy and Immunology

## 2019-06-16 ENCOUNTER — Other Ambulatory Visit: Payer: Self-pay

## 2019-06-16 VITALS — BP 130/78 | HR 91 | Temp 98.6°F | Resp 16 | Ht 73.5 in | Wt 195.2 lb

## 2019-06-16 DIAGNOSIS — J3089 Other allergic rhinitis: Secondary | ICD-10-CM

## 2019-06-16 DIAGNOSIS — R12 Heartburn: Secondary | ICD-10-CM | POA: Diagnosis not present

## 2019-06-16 DIAGNOSIS — Z87891 Personal history of nicotine dependence: Secondary | ICD-10-CM | POA: Insufficient documentation

## 2019-06-16 DIAGNOSIS — L282 Other prurigo: Secondary | ICD-10-CM | POA: Diagnosis not present

## 2019-06-16 DIAGNOSIS — Z889 Allergy status to unspecified drugs, medicaments and biological substances status: Secondary | ICD-10-CM

## 2019-06-16 MED ORDER — AZELASTINE-FLUTICASONE 137-50 MCG/ACT NA SUSP: 1.0000 | Freq: Two times a day (BID) | NASAL | 5 refills | Status: DC

## 2019-06-16 NOTE — Assessment & Plan Note (Signed)
Perennial rhinitis symptoms for the past 15 years with worsening in the spring and when laying down. Tried zyrtec, allegra and Flonase with some benefit. Sinus surgery many years ago. Takes omeprazole for reflux.  Today's skin testing showed: Positive to weed, trees, mold, cockroach and dust mites.  Start environmental control measures.   Start saline irrigations 1-2 times a day to help with the drainage.  Start dymista (fluticasone + azelastine nasal spray combination) 1 spray per nostril twice a day.  This replaces Flonase. If it's not covered let us know.  May use over the counter antihistamines such as Zyrtec (cetirizine), Claritin (loratadine), Allegra (fexofenadine), or Xyzal (levocetirizine) daily as needed.  Read about allergy injections.

## 2019-06-16 NOTE — Patient Instructions (Addendum)
Today's skin testing showed:  Positive to weed, trees, mold, cockroach and dust mites.  Start environmental control measures.   Start saline irrigations 1-2 times a day to help with the drainage.  Start dymista (fluticasone + azelastine nasal spray combination) 1 spray per nostril twice a day.  This replaces Flonase. If it's not covered let us know.  May use over the counter antihistamines such as Zyrtec (cetirizine), Claritin (loratadine), Allegra (fexofenadine), or Xyzal (levocetirizine) daily as needed.  Read about allergy injections.   Reflux:  Continue omeprazole 20mg  once a day in the morning. Nothing to eat or drink for 30 minutes afterwards.  See below for reflux diet.   Rash:  If not getting better, recommend follow up with dermatology for possible skin biopsy.  See below for proper skin care.   Follow up in 2 months or sooner if needed.   Reducing Pollen Exposure . Pollen seasons: trees (spring), grass (summer) and ragweed/weeds (fall). Marland Kitchen Keep windows closed in your home and car to lower pollen exposure.  Susa Simmonds air conditioning in the bedroom and throughout the house if possible.  . Avoid going out in dry windy days - especially early morning. . Pollen counts are highest between 5 - 10 AM and on dry, hot and windy days.  . Save outside activities for late afternoon or after a heavy rain, when pollen levels are lower.  . Avoid mowing of grass if you have grass pollen allergy. Marland Kitchen Be aware that pollen can also be transported indoors on people and pets.  . Dry your clothes in an automatic dryer rather than hanging them outside where they might collect pollen.  . Rinse hair and eyes before bedtime. Control of House Dust Mite Allergen . Dust mite allergens are a common trigger of allergy and asthma symptoms. While they can be found throughout the house, these microscopic creatures thrive in warm, humid environments such as bedding, upholstered furniture and  carpeting. . Because so much time is spent in the bedroom, it is essential to reduce mite levels there.  . Encase pillows, mattresses, and box springs in special allergen-proof fabric covers or airtight, zippered plastic covers.  . Bedding should be washed weekly in hot water (130 F) and dried in a hot dryer. Allergen-proof covers are available for comforters and pillows that can't be regularly washed.  Wendee Copp the allergy-proof covers every few months. Minimize clutter in the bedroom. Keep pets out of the bedroom.  Marland Kitchen Keep humidity less than 50% by using a dehumidifier or air conditioning. You can buy a humidity measuring device called a hygrometer to monitor this.  . If possible, replace carpets with hardwood, linoleum, or washable area rugs. If that's not possible, vacuum frequently with a vacuum that has a HEPA filter. . Remove all upholstered furniture and non-washable window drapes from the bedroom. . Remove all non-washable stuffed toys from the bedroom.  Wash stuffed toys weekly. Cockroach Allergen Avoidance Cockroaches are often found in the homes of densely populated urban areas, schools or commercial buildings, but these creatures can lurk almost anywhere. This does not mean that you have a dirty house or living area. . Block all areas where roaches can enter the home. This includes crevices, wall cracks and windows.  . Cockroaches need water to survive, so fix and seal all leaky faucets and pipes. Have an exterminator go through the house when your family and pets are gone to eliminate any remaining roaches. Marland Kitchen Keep food in lidded containers and  put pet food dishes away after your pets are done eating. Vacuum and sweep the floor after meals, and take out garbage and recyclables. Use lidded garbage containers in the kitchen. Wash dishes immediately after use and clean under stoves, refrigerators or toasters where crumbs can accumulate. Wipe off the stove and other kitchen surfaces and cupboards  regularly. Mold Control . Mold and fungi can grow on a variety of surfaces provided certain temperature and moisture conditions exist.  . Outdoor molds grow on plants, decaying vegetation and soil. The major outdoor mold, Alternaria and Cladosporium, are found in very high numbers during hot and dry conditions. Generally, a late summer - fall peak is seen for common outdoor fungal spores. Rain will temporarily lower outdoor mold spore count, but counts rise rapidly when the rainy period ends. . The most important indoor molds are Aspergillus and Penicillium. Dark, humid and poorly ventilated basements are ideal sites for mold growth. The next most common sites of mold growth are the bathroom and the kitchen. Outdoor (Seasonal) Mold Control . Use air conditioning and keep windows closed. . Avoid exposure to decaying vegetation. Marland Kitchen Avoid leaf raking. . Avoid grain handling. . Consider wearing a face mask if working in moldy areas.  Indoor (Perennial) Mold Control  . Maintain humidity below 50%. . Get rid of mold growth on hard surfaces with water, detergent and, if necessary, 5% bleach (do not mix with other cleaners). Then dry the area completely. If mold covers an area more than 10 square feet, consider hiring an indoor environmental professional. . For clothing, washing with soap and water is best. If moldy items cannot be cleaned and dried, throw them away. . Remove sources e.g. contaminated carpets. . Repair and seal leaking roofs or pipes. Using dehumidifiers in damp basements may be helpful, but empty the water and clean units regularly to prevent mildew from forming. All rooms, especially basements, bathrooms and kitchens, require ventilation and cleaning to deter mold and mildew growth. Avoid carpeting on concrete or damp floors, and storing items in damp areas.   Buffered Isotonic Saline Irrigations:  Goal: . When you irrigate with the isotonic saline (salt water) it washes mucous and  other debris from your nose that could be contributing to your nasal symptoms.   Recipe: Marland Kitchen Obtain 1 quart jar that is clean . Fill with clean (bottled, boiled or distilled) water . Add 1-2 heaping teaspoons of salt without iodine o If the solution with 2 teaspoons of salt is too strong, adjust the amount down until better tolerated . Add 1 teaspoon of [employer] baking soda (pure bicarbonate) . Mix ingredients together and store at room temperature and discard after 1 week * Alternatively you can buy pre made salt packets for the [employer] bottle or there          are other over the counter brands available  Instructions: . Warm  cup of the solution in the microwave if desired but be careful not to overheat as this will burn the inside of your nose . Stand over a sink (or do it while you shower) and squirt the solution into one side of your nose aiming towards the back of your head o Sometimes saying "coca cola" while irrigating can be helpful to prevent fluid from going down your throat  . The solution will travel to the back of your nose and then come out the other side . Perform this again on the other side . Try to do  this twice a day . If you are using a nasal spray in addition to the irrigation, irrigate first and then use the topical nasal spray otherwise you will wash the nasal spray out of your nose   Skin care recommendations  Bath time: . Always use lukewarm water. AVOID very hot or cold water. Marland Kitchen Keep bathing time to 5-10 minutes. . Do NOT use bubble bath. . Use a mild soap and use just enough to wash the dirty areas. . Do NOT scrub skin vigorously.  . After bathing, pat dry your skin with a towel. Do NOT rub or scrub the skin.  Moisturizers and prescriptions:  . ALWAYS apply moisturizers immediately after bathing (within 3 minutes). This helps to lock-in moisture. . Use the moisturizer several times a day over the whole body. Kermit Balo summer moisturizers include: Aveeno,  CeraVe, Cetaphil. Kermit Balo winter moisturizers include: Aquaphor, Vaseline, Cerave, Cetaphil, Eucerin, Vanicream. . When using moisturizers along with medications, the moisturizer should be applied about one hour after applying the medication to prevent diluting effect of the medication or moisturize around where you applied the medications. When not using medications, the moisturizer can be continued twice daily as maintenance.  Laundry and clothing: . Avoid laundry products with added color or perfumes. . Use unscented hypo-allergenic laundry products such as Tide free, Cheer free & gentle, and All free and clear.  . If the skin still seems dry or sensitive, you can try double-rinsing the clothes. . Avoid tight or scratchy clothing such as wool. . Do not use fabric softeners or dyer sheets.   Heartburn Heartburn is a type of pain or discomfort that can happen in the throat or chest. It is often described as a burning pain. It may also cause a bad, acid-like taste in the mouth. Heartburn may feel worse when you lie down or bend over. It may be worse at night. It may be caused by stomach contents that move back up (reflux) into the tube that connects the mouth with the stomach (esophagus). Follow these instructions at home: Eating and drinking   Avoid certain foods and drinks as told by your doctor. This may include: ? Coffee and tea (with or without caffeine). ? Drinks that have alcohol. ? Energy drinks and sports drinks. ? Carbonated drinks or sodas. ? Chocolate and cocoa. ? Peppermint and mint flavorings. ? Garlic and onions. ? Horseradish. ? Spicy and acidic foods, such as:  Peppers.  Chili powder and curry powder.  Vinegar.  Hot sauces and BBQ sauce. ? Citrus fruit juices and citrus fruits, such as:  Oranges.  Lemons.  Limes. ? Tomato-based foods, such as:  Red sauce and pizza with red sauce.  Chili.  Salsa. ? Fried and fatty foods, such as:  Donuts.  [place]  fries and potato chips.  High-fat dressings. ? High-fat meats, such as:  Hot dogs and sausage.  Rib eye steak.  Ham and bacon. ? High-fat dairy items, such as:  Whole milk.  Butter.  Cream cheese.  Eat small meals often. Avoid eating large meals.  Avoid drinking large amounts of liquid with your meals.  Avoid eating meals during the 2-3 hours before bedtime.  Avoid lying down right after you eat.  Do not exercise right after you eat. Lifestyle      If you are overweight, lose an amount of weight that is healthy for you. Ask your doctor about a safe weight loss goal.  Do not use any products that contain  nicotine or tobacco, including cigarettes, e-cigarettes, and chewing tobacco. These can make your symptoms worse. If you need help quitting, ask your doctor.  Wear loose clothes. Do not wear anything tight around your waist.  Raise (elevate) the head of your bed about 6 inches (15 cm) when you sleep.  Try to lower your stress. If you need help doing this, ask your doctor. General instructions  Pay attention to any changes in your symptoms.  Take over-the-counter and prescription medicines only as told by your doctor. ? Do not take aspirin, ibuprofen, or other NSAIDs unless your doctor says it is okay. ? Stop medicines only as told by your doctor.  Keep all follow-up visits as told by your doctor. This is important. Contact a doctor if:  You have new symptoms.  You lose weight and you do not know why it is happening.  You have trouble swallowing, or it hurts to swallow.  You have wheezing or a cough that keeps happening.  Your symptoms do not get better with treatment.  You have heartburn often for more than 2 weeks. Get help right away if:  You have pain in your arms, neck, jaw, teeth, or back.  You feel sweaty, dizzy, or light-headed.  You have chest pain or shortness of breath.  You throw up (vomit) and your throw up looks like blood or coffee  grounds.  Your poop (stool) is bloody or black. These symptoms may represent a serious problem that is an emergency. Do not wait to see if the symptoms will go away. Get medical help right away. Call your local emergency services (911 in the [place] Do not drive yourself to the hospital. Summary  Heartburn is a type of pain that can happen in the throat or chest. It can feel like a burning pain. It may also cause a bad, acid-like taste in the mouth.  You may need to avoid certain foods and drinks to help your symptoms. Ask your doctor what foods and drinks you should avoid.  Take over-the-counter and prescription medicines only as told by your doctor. Do not take aspirin, ibuprofen, or other NSAIDs unless your doctor told you to do so.  Contact your doctor if your symptoms do not get better or they get worse. This information is not intended to replace advice given to you by your health care provider. Make sure you discuss any questions you have with your health care provider. Document Revised: 09/17/2017 Document Reviewed: 09/17/2017 Elsevier Patient Education  [place].

## 2019-06-16 NOTE — Assessment & Plan Note (Signed)
Reactions to penicillin and tetanus toxoid as a child. Not sure of exact events.  Consider penicillin testing in future. Meanwhile continue to avoid.

## 2019-06-16 NOTE — Assessment & Plan Note (Signed)
   Continue omeprazole 20mg  once a day in the morning. Nothing to eat or drink for 30 minutes afterwards.  Gave handout on reflux diet.

## 2019-06-16 NOTE — Assessment & Plan Note (Signed)
Pruritic rash started a few years ago. No triggers. Dermatology evaluation was unremarkable but tested positive for propylene glycol on patch testing. Uses halobetasol with some benefit.  Discussed with patient that I'm not sure what's the cause of his rash.  If not getting better, recommend follow up with dermatology for possible skin biopsy.  Gave handout on proper skin care.

## 2019-06-16 NOTE — Progress Notes (Signed)
New Patient Note  RE: Edward Nelson MRN: [id number] DOB: February 25, 1966 Date of Office Visit: 06/16/2019  Referring provider: Caren Macadam, MD Primary care provider: Caren Macadam, MD  Chief Complaint: Allergic Rhinitis  (drainage all the time, feels like choking), Allergy Testing, and Rash (red bumps on arms and legs that are itchy)  History of Present Illness: I had the pleasure of seeing Edward Nelson for initial evaluation at the [facility] on 06/16/2019. He is a 54 y.o. male, who is referred here by Caren Macadam, MD for the evaluation of rhinitis.  Rhinitis: He reports symptoms of PND, rhinorrhea, watery eyes, nasal congestion. Symptoms have been going on for 15 years. The symptoms are present all year around with worsening in spring and worse when laying down. Other triggers include exposure to unknown. Anosmia: no. Headache: rarely. He has used Human resources officer, zyrtec, Flonase 1 spray BID with some improvement in symptoms. Sinus infections: no. Previous work up includes: none. Previous ENT evaluation: had sinus surgery many years ago with no benefit. Previous sinus imaging: not recently. History of nasal polyps: no. History of reflux: takes omeprazole daily.  Rash: Rash started about a few years ago and persistent. Mainly occurs on his arms and legs. Describes them as little bumps which is pruritic. Individual rashes lasts about a few days. No ecchymosis upon resolution. Associated symptoms include: none. Suspected triggers are unknown but worse with cold weather. Denies any fevers, chills, changes in medications, foods, personal care products or recent infections. He has tried the following therapies: halobetasol with some benefit.  Previous work up includes: saw dermatology and had patch testing - ? Contact dermatitis but positive to propylene glycol.  Patient not sure if he had the rash biopsied.  Assessment and Plan: Edward Nelson is a 54 y.o. male  with: Other allergic rhinitis Perennial rhinitis symptoms for the past 15 years with worsening in the spring and when laying down. Tried zyrtec, allegra and Flonase with some benefit. Sinus surgery many years ago. Takes omeprazole for reflux.  Today's skin testing showed: Positive to weed, trees, mold, cockroach and dust mites.  Start environmental control measures.   Start saline irrigations 1-2 times a day to help with the drainage.  Start dymista (fluticasone + azelastine nasal spray combination) 1 spray per nostril twice a day.  This replaces Flonase. If it's not covered let us know.  May use over the counter antihistamines such as Zyrtec (cetirizine), Claritin (loratadine), Allegra (fexofenadine), or Xyzal (levocetirizine) daily as needed.  Read about allergy injections.   Heartburn  Continue omeprazole 20mg  once a day in the morning. Nothing to eat or drink for 30 minutes afterwards.  Gave handout on reflux diet.   Pruritic rash Pruritic rash started a few years ago. No triggers. Dermatology evaluation was unremarkable but tested positive for propylene glycol on patch testing. Uses halobetasol with some benefit.  Discussed with patient that I'm not sure what's the cause of his rash.  If not getting better, recommend follow up with dermatology for possible skin biopsy.  Gave handout on proper skin care.   History of adverse drug reaction Reactions to penicillin and tetanus toxoid as a child. Not sure of exact events.  Consider penicillin testing in future. Meanwhile continue to avoid.   Return in about 2 months (around 08/14/2019).  Meds ordered this encounter  Medications  . Azelastine-Fluticasone 137-50 MCG/ACT SUSP    Sig: Place 1 spray into the nose in the morning and  at bedtime.    Dispense:  23 g    Refill:  5   Other allergy screening: Asthma: no Rhino conjunctivitis: yes Food allergy:  Cayenne pepper - breaks out in hives Eats other forms of peppers  with no issues.  Medication allergy: yes  Penicillin - reaction as a child and not sure of exact events.  Tetanus toxoid - reaction as a child Hymenoptera allergy: no Eczema:no History of recurrent infections suggestive of immunodeficency: no  Diagnostics: Skin Testing: Environmental allergy panel and select foods. Positive test to: weed, trees, mold, cockroach and dust mites. Negative test to: common foods.  Results discussed with patient/family. Airborne Adult Perc - 06/16/19 0934    Time Antigen Placed  [id number]    Allergen Manufacturer  Lavella Hammock    Location  Back    Number of Test  59    1. Control-Buffer 50% Glycerol  Negative    2. Control-Histamine 1 mg/ml  2+    3. Albumin saline  Negative    4. [place]  Negative    5. [place]  Negative    6. Johnson  Negative    7. [place] Blue  Negative    8. Meadow Fescue  Negative    9. Perennial Rye  Negative    10. Sweet Vernal  Negative    11. Timothy  Negative    12. Cocklebur  Negative    13. Burweed Marshelder  Negative    14. Ragweed, short  Negative    15. Ragweed, Giant  Negative    16. Plantain,  English  Negative    17. Lamb's Quarters  Negative    18. Sheep Sorrell  Negative    19. Rough Pigweed  Negative    20. Marsh Elder, Rough  Negative    21. Mugwort, Common  Negative    22. Ash mix  Negative    23. Birch mix  Negative    24. Beech American  Negative    25. Box, Elder  Negative    26. Cedar, red  Negative    27. Cottonwood, [place]  Negative    28. Elm mix  Negative    29. Hickory mix  Negative    30. Maple mix  Negative    31. Oak, [place] mix  Negative    32. Pecan Pollen  Negative    33. Pine mix  Negative    34. Sycamore Eastern  Negative    35. [place], Black Pollen  Negative    36. Alternaria alternata  Negative    37. Cladosporium Herbarum  Negative    38. Aspergillus mix  Negative    39. Penicillium mix  Negative    40. Bipolaris sorokiniana (Helminthosporium)  Negative    41. Drechslera spicifera  (Curvularia)  Negative    42. Mucor plumbeus  Negative    43. Fusarium moniliforme  Negative    44. Aureobasidium pullulans (pullulara)  Negative    45. Rhizopus oryzae  Negative    46. Botrytis cinera  Negative    47. Epicoccum nigrum  Negative    48. Phoma betae  Negative    49. Candida Albicans  Negative    50. Trichophyton mentagrophytes  Negative    51. Mite, D Farinae  5,000 AU/ml  Negative    52. Mite, D Pteronyssinus  5,000 AU/ml  Negative    53. Cat Hair 10,000 BAU/ml  Negative    54.  Dog Epithelia  Negative    55. Mixed Feathers  Negative    56. Horse Epithelia  Negative    57. Cockroach, German  Negative    58. Mouse  Negative    59. Tobacco Leaf  Negative     Food Perc - 06/16/19 0934    Time Antigen Placed  [id number]    Allergen Manufacturer  Lavella Hammock    Location  Back    Number of allergen test  10    1. Peanut  Negative    2. Soybean food  Negative    3. Wheat, whole  Negative    4. Sesame  Negative    5. Milk, cow  Negative    6. Egg White, chicken  Negative    7. Casein  Negative    8. Shellfish mix  Negative    9. Fish mix  Negative    10. Cashew  Negative     Intradermal - 06/16/19 1032    Time Antigen Placed  1032    Allergen Manufacturer  Greer    Location  Arm    Number of Test  15    Intradermal  Select    Control  Negative    Guatem[place]  Negative    Johnson  Negative    7 Grass  Negative    Ragweed mix  Negative    Weed mix  2+    Tree mix  3+    Mold 1  3+    Mold 2  Negative    Mold 3  2+    Mold 4  2+    Cat  Negative    Dog  Negative    Cockroach  3+    Mite mix  3+       Past Medical History: Patient Active Problem List   Diagnosis Date Noted  . Heartburn 06/16/2019  . Pruritic rash 06/16/2019  . History of adverse drug reaction 06/16/2019  . History of smoking 06/16/2019  . Other allergic rhinitis 07/20/2009  . DYSPNEA 07/20/2009  . CHEST DISCOMFORT 07/20/2009  . HEMORRHOIDS 08/30/2007  . GERD 08/30/2007  . HIATAL HERNIA  08/30/2007   Past Medical History:  Diagnosis Date  . Enlarged prostate    treated by urologist with [facility]  . GERD (gastroesophageal reflux disease)    Past Surgical History: Past Surgical History:  Procedure Laterality Date  . BLADDER SURGERY  2010   Transurethral incision of bladder neck  . [place] SURGERY  2013  . KNEE ARTHROSCOPY Left   . NASAL SINUS SURGERY Left    started bleeding so didn't complete right side   Medication List:  Current Outpatient Medications  Medication Sig Dispense Refill  . desonide (DESOWEN) 0.05 % cream Apply to affected areas twice a day until smooth. For face, nipples, genitals.    . fexofenadine (ALLEGRA) 180 MG tablet Take by mouth.    . halobetasol (ULTRAVATE) 0.05 % cream Apply topically.    . Multiple Vitamin (MULTIVITAMIN) capsule Take 1 capsule by mouth daily.    Marland Kitchen omeprazole (PRILOSEC) 20 MG capsule TAKE ONE CAPSULE BY MOUTH ONCE DAILY    . tadalafil (CIALIS) 5 MG tablet TAKE ONE TABLET BY MOUTH ONCE DAILY    . Azelastine-Fluticasone 137-50 MCG/ACT SUSP Place 1 spray into the nose in the morning and at bedtime. 23 g 5   No current facility-administered medications for this visit.   Allergies: Allergies  Allergen Reactions  . Cayenne   . Penicillins   . Tetanus Toxoid    Social History:  Social History   Socioeconomic History  . Marital status: Single    Spouse name: Not on file  . Number of children: Not on file  . Years of education: Not on file  . Highest education level: Not on file  Occupational History  . Not on file  Tobacco Use  . Smoking status: Former Smoker    Packs/day: 1.00    Years: 35.00    Pack years: 35.00    Types: Cigarettes    Start date: 10    Quit date: 2015    Years since quitting: 6.1  . Smokeless tobacco: Never Used  Substance and Sexual Activity  . Alcohol use: Not on file  . Drug use: Not on file  . Sexual activity: Not on file  Other Topics Concern  . Not on file  Social  History Narrative  . Not on file   Social Determinants of Health   Financial Resource Strain:   . Difficulty of Paying Living Expenses: Not on file  Food Insecurity:   . Worried About Charity fundraiser in the Last Year: Not on file  . Ran Out of Food in the Last Year: Not on file  Transportation Needs:   . Lack of Transportation (Medical): Not on file  . Lack of Transportation (Non-Medical): Not on file  Physical Activity:   . Days of Exercise per Week: Not on file  . Minutes of Exercise per Session: Not on file  Stress:   . Feeling of Stress : Not on file  Social Connections:   . Frequency of Communication with Friends and Family: Not on file  . Frequency of Social Gatherings with Friends and Family: Not on file  . Attends Religious Services: Not on file  . Active Member of Clubs or Organizations: Not on file  . Attends Archivist Meetings: Not on file  . Marital Status: Not on file   Lives in a 56 yr old home. Smoking: quit in 2015, smoked 1ppd x 10 yrs Occupation: Chief Technology Officer History: Water Damage/mildew in the house: no Carpet in the family room: no Carpet in the bedroom: no Heating: electric Cooling: central Pet: no  Family History: Family History  Problem Relation Age of Onset  . Breast cancer Mother   . Osteoporosis Mother   . Hypertension Mother   . Arthritis Mother   . Cancer - Other Father        oral  . Breast cancer Sister   . Hypertension Sister   . Other Brother        acid reflux   Problem                               Relation Asthma                                   No  Eczema                                No  Food allergy                          No  Allergic rhino conjunctivitis     No   Review of Systems  Constitutional: Negative for appetite change, chills, fever and  unexpected weight change.  HENT: Positive for congestion, postnasal drip and rhinorrhea.   Eyes: Negative for itching.  Respiratory: Negative  for cough, chest tightness, shortness of breath and wheezing.   Cardiovascular: Negative for chest pain.  Gastrointestinal: Negative for abdominal pain.  Genitourinary: Negative for difficulty urinating.  Skin: Positive for rash.  Allergic/Immunologic: Positive for environmental allergies. Negative for food allergies.  Neurological: Negative for headaches.   Objective: BP 130/78 (BP Location: Left Arm, Patient Position: Sitting, Cuff Size: Normal)   Pulse 91   Temp 98.6 F (37 C) (Temporal)   Resp 16   Ht 6' 1.5" (1.867 m)   Wt 195 lb 3.2 oz (88.5 kg)   SpO2 97%   BMI 25.40 kg/m  Body mass index is 25.4 kg/m. Physical Exam  Constitutional: He is oriented to person, place, and time. He appears well-developed and well-nourished.  HENT:  Head: Normocephalic and atraumatic.  Right Ear: External ear normal.  Left Ear: External ear normal.  Nose: Nose normal.  Mouth/Throat: Oropharynx is clear and moist.  Eyes: Conjunctivae and EOM are normal.  Cardiovascular: Normal rate, regular rhythm and normal heart sounds. Exam reveals no gallop and no friction rub.  No murmur heard. Pulmonary/Chest: Effort normal and breath sounds normal. He has no wheezes. He has no rales.  Abdominal: Soft.  Musculoskeletal:     Cervical back: Neck supple.  Neurological: He is alert and oriented to person, place, and time.  Skin: Skin is warm. Rash noted.  Non blanchable, dark red papular clusters on right anterior shin area. 2 slightly erythematous papular rash on right elbow area.   Psychiatric: He has a normal mood and affect. His behavior is normal.  Nursing note and vitals reviewed.  The plan was reviewed with the patient/family, and all questions/concerned were addressed.  It was my pleasure to see Edward Nelson today and participate in his care. Please feel free to contact me with any questions or concerns.  Sincerely,  Rexene Alberts, DO Allergy & Immunology  [facility] office: [phone] [facility] office: [place] office: [phone]

## 2019-06-19 ENCOUNTER — Ambulatory Visit: Payer: BC Managed Care – PPO | Admitting: Gastroenterology

## 2019-06-23 ENCOUNTER — Encounter: Payer: Self-pay | Admitting: Family Medicine

## 2019-06-23 ENCOUNTER — Other Ambulatory Visit: Payer: Self-pay

## 2019-06-23 ENCOUNTER — Ambulatory Visit (INDEPENDENT_AMBULATORY_CARE_PROVIDER_SITE_OTHER): Payer: BC Managed Care – PPO | Admitting: Family Medicine

## 2019-06-23 VITALS — BP 132/64 | HR 89 | Temp 98.6°F | Ht 72.5 in | Wt 196.8 lb

## 2019-06-23 DIAGNOSIS — R252 Cramp and spasm: Secondary | ICD-10-CM | POA: Diagnosis not present

## 2019-06-23 DIAGNOSIS — R5383 Other fatigue: Secondary | ICD-10-CM

## 2019-06-23 DIAGNOSIS — Z0000 Encounter for general adult medical examination without abnormal findings: Secondary | ICD-10-CM

## 2019-06-23 LAB — TSH: TSH: 0.93 u[IU]/mL (ref 0.35–4.50)

## 2019-06-23 LAB — VITAMIN D 25 HYDROXY (VIT D DEFICIENCY, FRACTURES): VITD: 34.17 ng/mL (ref 30.00–100.00)

## 2019-06-23 LAB — VITAMIN B12: Vitamin B-12: 399 pg/mL (ref 211–911)

## 2019-06-23 NOTE — Progress Notes (Signed)
Kingjosiah Recendez DOB: Jan 16, 1966 Encounter date: 06/23/2019  This is a 54 y.o. male who presents for complete physical   History of present illness/Additional concerns:  Sinuses/allergies: so so. Just saw allergist other day. Doing sinus irrigation now. Also using prescription nasal spray with steroid. Positive allergy for dust mites, cockroach, pollen.   Acid reflux - Omeprazole does help with symptoms. Has appointment with GI in March.   Had colonoscopy in 2006 in [place]; had some polyps. Due for repeat. Has appointment next month.   Went to vein clinic in [facility] - was going to get veins in bilat legs cared for (L worse than right); but put on hold due to [place].   Sees Dr. Delman Cheadle for dermatology.  Sees Dr. Henreitta Leber for urology. Last visit urolift procedure was suggested. Sometimes has difficulty with urination.   Started night shifts in January.   [employer] [employer] pm for leg cramps which helps.   Past Medical History:  Diagnosis Date  . Enlarged prostate    treated by urologist with [facility]  . GERD (gastroesophageal reflux disease)    Past Surgical History:  Procedure Laterality Date  . BLADDER SURGERY  2010   Transurethral incision of bladder neck  . [place] SURGERY  2013  . KNEE ARTHROSCOPY Left   . NASAL SINUS SURGERY Left    started bleeding so didn't complete right side  . right leg fracture  1986   tibial/fib fracture with surgical correction, plate   Allergies  Allergen Reactions  . Cayenne   . Penicillins   . Tetanus Toxoid    Current Meds  Medication Sig  . Azelastine-Fluticasone 137-50 MCG/ACT SUSP Place 1 spray into the nose in the morning and at bedtime.  Marland Kitchen desonide (DESOWEN) 0.05 % cream Apply to affected areas twice a day until smooth. For face, nipples, genitals.  . fexofenadine (ALLEGRA) 180 MG tablet Take by mouth.  . halobetasol (ULTRAVATE) 0.05 % cream Apply topically.  . Multiple Vitamin (MULTIVITAMIN) capsule Take 1  capsule by mouth daily.  Marland Kitchen omeprazole (PRILOSEC) 20 MG capsule TAKE ONE CAPSULE BY MOUTH ONCE DAILY  . tadalafil (CIALIS) 5 MG tablet TAKE ONE TABLET BY MOUTH ONCE DAILY   Social History   Tobacco Use  . Smoking status: Former Smoker    Packs/day: 1.00    Years: 35.00    Pack years: 35.00    Types: Cigarettes    Start date: 10    Quit date: 2015    Years since quitting: 6.1  . Smokeless tobacco: Never Used  Substance Use Topics  . Alcohol use: Yes    Comment: occasionally   Family History  Problem Relation Age of Onset  . Breast cancer Mother   . Osteoporosis Mother   . Hypertension Mother   . Arthritis Mother   . Cancer - Other Father        oral  . Breast cancer Sister   . Hypertension Sister   . Other Brother        acid reflux     Review of Systems  Constitutional: Negative for activity change, appetite change, chills, fatigue, fever and unexpected weight change.  HENT: Negative for congestion, ear pain, hearing loss, sinus pressure, sinus pain, sore throat and trouble swallowing.   Eyes: Negative for pain and visual disturbance.  Respiratory: Negative for cough, chest tightness, shortness of breath and wheezing.   Cardiovascular: Negative for chest pain, palpitations and leg swelling.  Gastrointestinal: Negative for abdominal distention,  abdominal pain, blood in stool, constipation, diarrhea, nausea and vomiting.  Genitourinary: Negative for decreased urine volume, difficulty urinating, dysuria, penile pain and testicular pain.  Musculoskeletal: Negative for arthralgias, back pain and joint swelling.  Skin: Negative for rash.  Neurological: Negative for dizziness, weakness, numbness and headaches.  Hematological: Negative for adenopathy. Does not bruise/bleed easily.  Psychiatric/Behavioral: Negative for agitation, sleep disturbance and suicidal ideas. The patient is not nervous/anxious.     CBC:  Lab Results  Component Value Date   WBC 7.5 05/16/2019    HGB 16.6 05/16/2019   HCT 49.6 05/16/2019   MCHC 33.6 05/16/2019   RDW 13.6 05/16/2019   PLT 164.0 05/16/2019   CMP: Lab Results  Component Value Date   NA 139 05/16/2019   K 4.0 05/16/2019   CL 104 05/16/2019   CO2 27 05/16/2019   GLUCOSE 91 05/16/2019   BUN 11 05/16/2019   CREATININE 0.98 05/16/2019   CALCIUM 9.6 05/16/2019   PROT 6.5 05/16/2019   BILITOT 0.5 05/16/2019   ALKPHOS 76 05/16/2019   ALT 19 05/16/2019   AST 16 05/16/2019   LIPID: Lab Results  Component Value Date   CHOL 181 05/16/2019   TRIG 158.0 (H) 05/16/2019   HDL 41.30 05/16/2019   LDLCALC 108 (H) 05/16/2019    Objective:  BP 140/70 (BP Location: Left Arm, Patient Position: Sitting, Cuff Size: Large)   Pulse 89   Temp 98.6 F (37 C) (Temporal)   Ht 6' 0.5" (1.842 m)   Wt 196 lb 12.8 oz (89.3 kg)   SpO2 98%   BMI 26.32 kg/m   Weight: 196 lb 12.8 oz (89.3 kg)   BP Readings from Last 3 Encounters:  06/23/19 140/70  06/16/19 130/78  05/16/19 124/80   Wt Readings from Last 3 Encounters:  06/23/19 196 lb 12.8 oz (89.3 kg)  06/16/19 195 lb 3.2 oz (88.5 kg)  05/16/19 200 lb 14.4 oz (91.1 kg)    Physical Exam Constitutional:      General: He is not in acute distress.    Appearance: He is well-developed.  HENT:     Head: Normocephalic and atraumatic.     Right Ear: External ear normal.     Left Ear: External ear normal.     Nose: Nose normal.     Mouth/Throat:     Pharynx: No oropharyngeal exudate.  Eyes:     Conjunctiva/sclera: Conjunctivae normal.     Pupils: Pupils are equal, round, and reactive to light.  Neck:     Thyroid: No thyromegaly.  Cardiovascular:     Rate and Rhythm: Normal rate and regular rhythm.     Heart sounds: Normal heart sounds. No murmur. No friction rub. No gallop.   Pulmonary:     Effort: Pulmonary effort is normal. No respiratory distress.     Breath sounds: Normal breath sounds. No stridor. No wheezing or rales.  Abdominal:     General: Bowel sounds are  normal.     Palpations: Abdomen is soft.  Musculoskeletal:        General: Normal range of motion.     Cervical back: Neck supple.  Skin:    General: Skin is warm and dry.     Comments: Nontender varicosities bilateral lower extremities anterior more prominent than posterior.  Neurological:     Mental Status: He is alert and oriented to person, place, and time.  Psychiatric:        Behavior: Behavior normal.  Thought Content: Thought content normal.        Judgment: Judgment normal.     Assessment/Plan: Health Maintenance Due  Topic Date Due  . COLONOSCOPY  05/01/2014   Health Maintenance reviewed -has upcoming colonoscopy.  Otherwise up-to-date.  He is unable to get a tetanus vaccination due to prior allergy.  Information about shingles vaccination was given and he was encouraged to consider completing this.  Information also given about COVID-19 vaccination.  1. Preventative health care Keep up with active lifestyle and continue to work on healthy eating.  He has some sleep shift disorder which we may need to address separately, but initially will make sure no other contributions to fatigue.  Consider shingles vaccination.  He has upcoming dermatology evaluation for full skin check, follows with urology regularly, and has upcoming appointment with GI for colon cancer screening. - HIV Antibody (routine testing w rflx); Future - Hepatitis C antibody; Future - Hepatitis C antibody - HIV Antibody (routine testing w rflx) - Magnesium  2. Fatigue, unspecified type See above.  Suspect fatigue related to working night shifts and working 6 days a week. - Vitamin B12; Future - VITAMIN D 25 Hydroxy (Vit-D Deficiency, Fractures); Future - IBC + Ferritin; Future - TSH; Future - TSH - IBC + Ferritin - Vitamin B12 - VITAMIN D 25 Hydroxy (Vit-D Deficiency, Fractures) - Magnesium  3. Muscle cramps Work on staying well-hydrated.  Blood work when able. - TSH; Future - TSH -  Magnesium  Return for pending bloodwork.  Micheline Rough, MD

## 2019-06-23 NOTE — Patient Instructions (Addendum)
We are committed to keeping you informed about the COVID-19 vaccine.  As the vaccine continues to become available for each phase, we will ensure that patients who meet the criteria receive the information they need to access vaccination opportunities. Continue to check your MyChart account and [email or web address] for updates. Please review the Phase 1b information below.  [place] On Tuesday, Jan. 19, the [facility]) and [place] began large-scale COVID-19 vaccinations at the [place]. The vaccinations are appointment only and for those 36 and older.  Walk-ins will not be accepted.  All appointments are currently filled. Please join our waiting list for the next available appointments. We will contact you when appointments become available. Please do not sign up more than once.  Join Our Waiting List  Our daily vaccination capacity will continue to increase, including expansion of our [employer] site, increasing mobile clinics across our service region and plans to open COVID-19 vaccination clinics in [place] and [place] counties.  Other Vaccination Opportunities in [place] We are also working in partnership with county health agencies in our service counties to ensure continuing vaccination availability in the weeks and months ahead. Learn more about each county's vaccination efforts in the website links below:   [place]'s phase 1b vaccination guidelines, prioritizing those 65 and over as the next eligible group to receive the COVID-19 vaccine, are detailed at [email or web address].   Vaccine Safety and Effectiveness Clinical trials for the Pfizer COVID-19 vaccine involved 42,000 people and showed that the vaccine is more than 95% effective in preventing COVID-19 with no serious  safety concerns. Similar results have been reported for the Moderna COVID-19 vaccine. Side effects reported in the [place] clinical trials include a sore arm at the injection site, fatigue, headache, chills and fever. While side effects from the [place] COVID-19 vaccine are higher than for a typical flu vaccine, they are lower in many ways than side effects from the leading vaccine to prevent shingles. Side effects are signs that a vaccine is working and are related to your immune system being stimulated to produce antibodies against infection. Side effects from vaccination are far less significant than health impacts from COVID-19.  Staying Informed Pharmacists, infectious disease doctors, critical care nurses and other experts at [facility] continue to speak publicly through media interviews and direct communication with our patients and communities about the safety, effectiveness and importance of vaccines to eliminate COVID-19. In addition, reliable information on vaccine safety, effectiveness, side effects and more is available on the following websites:  [place] Department of Health and Human Services COVID-19 Vaccine Information Website.  U.S. Centers for Disease Control and Prevention XX123456 Human resources officer.  Staying Safe We agree with the CDC on what we can do to help our communities get back to normal: Getting "back to normal" is going to take all of our tools. If we use all the tools we have, we stand the best chance of getting our families, communities, schools and workplaces "back to normal" sooner:  Get vaccinated as soon as vaccines become available within the phase of the state's vaccination rollout plan for which you meet the eligibility criteria.  Wear a mask.  Stay 6 feet from others and avoid crowds.  Wash hands often.  For our most current information, please visit [email or web address].   Zoster Vaccine, Recombinant injection What is this  medicine? ZOSTER VACCINE (ZOS ter vak SEEN) is used to prevent shingles in adults 56 years old and over. This vaccine is not used to treat shingles or nerve pain from shingles. This medicine may be used for other purposes; ask your health care provider or pharmacist if you have questions. COMMON BRAND NAME(S): [facility] What should I tell my health care provider before I take this medicine? They need to know if you have any of these conditions:  blood disorders or disease  cancer like leukemia or lymphoma  immune system problems or therapy  an unusual or allergic reaction to vaccines, other medications, foods, dyes, or preservatives  pregnant or trying to get pregnant  breast-feeding How should I use this medicine? This vaccine is for injection in a muscle. It is given by a health care professional. Talk to your pediatrician regarding the use of this medicine in children. This medicine is not approved for use in children. Overdosage: If you think you have taken too much of this medicine contact a poison control center or emergency room at once. NOTE: This medicine is only for you. Do not share this medicine with others. What if I miss a dose? Keep appointments for follow-up (booster) doses as directed. It is important not to miss your dose. Call your doctor or health care professional if you are unable to keep an appointment. What may interact with this medicine?  medicines that suppress your immune system  medicines to treat cancer  steroid medicines like prednisone or cortisone This list may not describe all possible interactions. Give your health care provider a list of all the medicines, herbs, non-prescription drugs, or dietary supplements you use. Also tell them if you smoke, drink alcohol, or use illegal drugs. Some items may interact with your medicine. What should I watch for while using this medicine? Visit your doctor for regular check ups. This vaccine, like all  vaccines, may not fully protect everyone. What side effects may I notice from receiving this medicine? Side effects that you should report to your doctor or health care professional as soon as possible:  allergic reactions like skin rash, itching or hives, swelling of the face, lips, or tongue  breathing problems Side effects that usually do not require medical attention (report these to your doctor or health care professional if they continue or are bothersome):  chills  headache  fever  nausea, vomiting  redness, warmth, pain, swelling or itching at site where injected  tiredness This list may not describe all possible side effects. Call your doctor for medical advice about side effects. You may report side effects to FDA at 1-800-FDA-1088. Where should I keep my medicine? This vaccine is only given in a clinic, pharmacy, doctor's office, or other health care setting and will not be stored at home. NOTE: This sheet is a summary. It may not cover all possible information. If you have questions about this medicine, talk to your doctor, pharmacist, or health care provider.  2020 Elsevier/Gold Standard (2016-11-27 13:20:30)

## 2019-06-24 LAB — IBC + FERRITIN
Ferritin: 125.6 ng/mL (ref 22.0–322.0)
Iron: 24 ug/dL — ABNORMAL LOW (ref 42–165)
Saturation Ratios: 9.6 % — ABNORMAL LOW (ref 20.0–50.0)
Transferrin: 178 mg/dL — ABNORMAL LOW (ref 212.0–360.0)

## 2019-06-24 LAB — MAGNESIUM: Magnesium: 2 mg/dL (ref 1.5–2.5)

## 2019-06-26 LAB — HEPATITIS C ANTIBODY
Hepatitis C Ab: NONREACTIVE
SIGNAL TO CUT-OFF: 0.02 (ref <1.00)

## 2019-06-26 LAB — HIV ANTIBODY (ROUTINE TESTING W REFLEX): HIV 1&2 Ab, 4th Generation: NONREACTIVE

## 2019-06-26 LAB — MAGNESIUM, RBC

## 2019-07-14 DIAGNOSIS — Z87438 Personal history of other diseases of male genital organs: Secondary | ICD-10-CM | POA: Diagnosis not present

## 2019-07-14 DIAGNOSIS — N401 Enlarged prostate with lower urinary tract symptoms: Secondary | ICD-10-CM | POA: Diagnosis not present

## 2019-07-14 DIAGNOSIS — N528 Other male erectile dysfunction: Secondary | ICD-10-CM | POA: Diagnosis not present

## 2019-07-14 DIAGNOSIS — R3914 Feeling of incomplete bladder emptying: Secondary | ICD-10-CM | POA: Diagnosis not present

## 2019-07-24 ENCOUNTER — Encounter: Payer: Self-pay | Admitting: Gastroenterology

## 2019-07-24 ENCOUNTER — Other Ambulatory Visit: Payer: Self-pay

## 2019-07-24 ENCOUNTER — Ambulatory Visit: Payer: BC Managed Care – PPO | Admitting: Gastroenterology

## 2019-07-24 VITALS — BP 122/74 | HR 88 | Temp 98.7°F | Ht 72.0 in | Wt 200.0 lb

## 2019-07-24 DIAGNOSIS — D509 Iron deficiency anemia, unspecified: Secondary | ICD-10-CM | POA: Diagnosis not present

## 2019-07-24 DIAGNOSIS — Z01818 Encounter for other preprocedural examination: Secondary | ICD-10-CM | POA: Diagnosis not present

## 2019-07-24 DIAGNOSIS — K219 Gastro-esophageal reflux disease without esophagitis: Secondary | ICD-10-CM | POA: Diagnosis not present

## 2019-07-24 MED ORDER — SUTAB 1479-225-188 MG PO TABS: 1.0000 | ORAL_TABLET | ORAL | 0 refills | Status: DC

## 2019-07-24 NOTE — Patient Instructions (Signed)
You have been scheduled for an endoscopy and colonoscopy. Please follow the written instructions given to you at your visit today. Please pick up your prep supplies at the pharmacy within the next 1-3 days. If you use inhalers (even only as needed), please bring them with you on the day of your procedure.  Continue Omeprazole   I value your feedback and thank you for entrusting [place] with your care. If you get a [facility] patient survey, I would appreciate you taking the time to let us know about your experience today. Thank you!   Due to recent changes in healthcare laws, you may see the results of your imaging and laboratory studies on MyChart before your provider has had a chance to review them.  We understand that in some cases there may be results that are confusing or concerning to you. Not all laboratory results come back in the same time frame and the provider may be waiting for multiple results in order to interpret others.  Please give [place] 48 hours in order for your provider to thoroughly review all the results before contacting the office for clarification of your results.

## 2019-07-24 NOTE — Progress Notes (Signed)
Referring Provider: Caren Macadam, MD Primary Care Physician:  Caren Macadam, MD  Reason for Consultation: Reflux   IMPRESSION:  Reflux on omeprazole Iron deficiency without anemia History of colon polyps on a colonoscopy in [place] in 2000 Hemorrhoids status post hemorrhoidectomy No known family history of colon cancer or polyps  Unexplained iron deficiency: On oral iron supplements QOD.  Recommend EGD with duodenal biopsies and colonoscopy to evaluate for GI blood loss anemia.  History of colon polyps: Last colonoscopy 20 years ago in [place].  Colonoscopy recommended at this time.  Reflux: Adequately controlled on omeprazole.  He is currently using OTC therapy with adequate control of symptoms.  He declined prescription due to cost savings with over-the-counter medications on his insurance plan.  PLAN: Obtain endoscopy reports from Dr. Randa Ngo, [facility] Continue omeprazole EGD and colonoscopy  The nature of the procedure, as well as the risks, benefits, and alternatives were carefully and thoroughly reviewed with the patient. Ample time for discussion and questions allowed. The patient understood, was satisfied, and agreed to proceed.  Please see the "Patient Instructions" section for addition details about the plan.  HPI: Edward Nelson is a 54 y.o. male by Dr. Ethlyn Gallery for acid reflux.  The history is obtained through the patient and review of his electronic health record.  He has allergic rhinitis former smoker having quit in 2015. He has a history of hemorrhoids s/p hemorrhoidectomy, chronic prostatitis, and type 3 burning mouth syndrome, and hypertension.  He has not had Covid or the Covid vaccine.   Has had reflux since the early 2000's.  An endoscopy in approximately 2004 that was reportedly normal.  Symptoms have been controlled on omeprazole daily since that time.  His symptoms return if he stops omeprazole.  Rare  breakthrough symptoms triggered by citrus.  No evidence for anorexia, unexplained weight loss, dysphagia, odynophagia, dysphonia, persistent vomiting, or gastrointestinal cancer in a first-degree relative.  He works the night shift and therefore eats late at night.  Per the patient's report, routine annual labs:   Labs from 05/16/2019 show a hemoglobin of 16.6, MCV 91.3, RDW 13.6 Labs from 2/26//21 show an iron of 24, ferritin 125.6, transferrin 178, saturation ratios 9.6  EGD for reflux and Colonoscopy for unclear reasons but potentially due to hemorrhoids in 2000 in [place].  Polyps were removed. Evaluated by Dr. Roney Mans in 2018. Endoscopy recommended but not every scheduled.   No symptoms associated with the iron deficiency.  No fatigue, weakness, headache, irritability, exercise intolerance, exertional dyspnea, vertigo, or angina pectoris.  No pica or pagophagia.  No beeturia.  No hearing loss.    No overt GI blood loss. No melena, hematochezia, bright red blood per rectum except for rare bleeding that he attributes to hemorrhoids. No epistaxis, hemoptysis, or hematuria. Occasional blood when he blows his nose.   No identified exacerbating or relieving features.  Taking B12 and oral iron QOD for one month. Noting some improved energy.   Father with oral cancer.  Mother has a large hiatal hernia. SBrother with acid reflux. No known family history of colon cancer or polyps. No family history of uterine/endometrial cancer, pancreatic cancer or gastric/stomach cancer.   Past Medical History:  Diagnosis Date  . Enlarged prostate    treated by urologist with [facility]  . GERD (gastroesophageal reflux disease)     Past Surgical History:  Procedure Laterality Date  . BLADDER SURGERY  2010   Transurethral incision  of bladder neck  . [place] SURGERY  2013  . KNEE ARTHROSCOPY Left   . NASAL SINUS SURGERY Left    started bleeding so didn't complete right side  . right leg  fracture  1986   tibial/fib fracture with surgical correction, plate    Current Outpatient Medications  Medication Sig Dispense Refill  . Azelastine-Fluticasone 137-50 MCG/ACT SUSP Place 1 spray into the nose in the morning and at bedtime. 23 g 5  . desonide (DESOWEN) 0.05 % cream Apply to affected areas twice a day until smooth. For face, nipples, genitals.    . fexofenadine (ALLEGRA) 180 MG tablet Take by mouth.    . halobetasol (ULTRAVATE) 0.05 % cream Apply topically.    . Multiple Vitamin (MULTIVITAMIN) capsule Take 1 capsule by mouth daily.    Marland Kitchen omeprazole (PRILOSEC) 20 MG capsule TAKE ONE CAPSULE BY MOUTH ONCE DAILY    . tadalafil (CIALIS) 5 MG tablet TAKE ONE TABLET BY MOUTH ONCE DAILY     No current facility-administered medications for this visit.    Allergies as of 07/24/2019 - Review Complete 06/23/2019  Allergen Reaction Noted  . [facility]  05/19/2019  . Penicillins    . Tetanus toxoid      Family History  Problem Relation Age of Onset  . Breast cancer Mother   . Osteoporosis Mother   . Hypertension Mother   . Arthritis Mother   . Cancer - Other Father        oral  . Breast cancer Sister   . Hypertension Sister   . Other Brother        acid reflux    Social History   Socioeconomic History  . Marital status: Single    Spouse name: Not on file  . Number of children: Not on file  . Years of education: Not on file  . Highest education level: Not on file  Occupational History  . Not on file  Tobacco Use  . Smoking status: Former Smoker    Packs/day: 1.00    Years: 35.00    Pack years: 35.00    Types: Cigarettes    Start date: 22    Quit date: 2015    Years since quitting: 6.2  . Smokeless tobacco: Never Used  Substance and Sexual Activity  . Alcohol use: Yes    Comment: occasionally  . Drug use: Never  . Sexual activity: Not Currently    Partners: Female  Other Topics Concern  . Not on file  Social History Narrative  . Not on file   Social  Determinants of Health   Financial Resource Strain:   . Difficulty of Paying Living Expenses:   Food Insecurity:   . Worried About Charity fundraiser in the Last Year:   . Arboriculturist in the Last Year:   Transportation Needs:   . Film/video editor (Medical):   Marland Kitchen Lack of Transportation (Non-Medical):   Physical Activity:   . Days of Exercise per Week:   . Minutes of Exercise per Session:   Stress:   . Feeling of Stress :   Social Connections:   . Frequency of Communication with Friends and Family:   . Frequency of Social Gatherings with Friends and Family:   . Attends Religious Services:   . Active Member of Clubs or Organizations:   . Attends Archivist Meetings:   Marland Kitchen Marital Status:   Intimate Partner Violence:   . Fear of Current or  Ex-Partner:   . Emotionally Abused:   Marland Kitchen Physically Abused:   . Sexually Abused:     Review of Systems: 12 system ROS is negative except as noted above except for allergies, back pain, muscles pains, and rash.   Physical Exam: General:   Alert,  well-nourished, pleasant and cooperative in NAD Head:  Normocephalic and atraumatic. Eyes:  Sclera clear, no icterus.   Conjunctiva pink. Ears:  Normal auditory acuity. Nose:  No deformity, discharge,  or lesions. Mouth:  No deformity or lesions.  No chelosis. Neck:  Supple; no masses or thyromegaly. Lungs:  Clear throughout to auscultation.   No wheezes. Heart:  Regular rate and rhythm; no murmurs. Abdomen:  Soft, nontender, nondistended, normal bowel sounds, no rebound or guarding. No hepatosplenomegaly.   Rectal:  Deferred  Msk:  Symmetrical. No boney deformities LAD: No inguinal or umbilical LAD Extremities:  No clubbing or edema. Neurologic:  Alert and  oriented x4;  grossly nonfocal Skin:  Intact without significant lesions or rashes. Nails appear normal.  Psych:  Alert and cooperative. Normal mood and affect.    Chrisandra Wiemers L. Tarri Glenn, MD, MPH 07/24/2019, 8:19  AM

## 2019-08-14 DIAGNOSIS — L0291 Cutaneous abscess, unspecified: Secondary | ICD-10-CM | POA: Diagnosis not present

## 2019-08-15 DIAGNOSIS — N401 Enlarged prostate with lower urinary tract symptoms: Secondary | ICD-10-CM | POA: Diagnosis not present

## 2019-08-15 DIAGNOSIS — R3914 Feeling of incomplete bladder emptying: Secondary | ICD-10-CM | POA: Diagnosis not present

## 2019-08-20 ENCOUNTER — Encounter: Payer: BC Managed Care – PPO | Admitting: Family Medicine

## 2019-08-22 ENCOUNTER — Ambulatory Visit: Payer: BC Managed Care – PPO | Admitting: Allergy

## 2019-09-08 DIAGNOSIS — N528 Other male erectile dysfunction: Secondary | ICD-10-CM | POA: Diagnosis not present

## 2019-09-08 DIAGNOSIS — R3914 Feeling of incomplete bladder emptying: Secondary | ICD-10-CM | POA: Diagnosis not present

## 2019-09-08 DIAGNOSIS — Z48816 Encounter for surgical aftercare following surgery on the genitourinary system: Secondary | ICD-10-CM | POA: Diagnosis not present

## 2019-09-08 DIAGNOSIS — N401 Enlarged prostate with lower urinary tract symptoms: Secondary | ICD-10-CM | POA: Diagnosis not present

## 2019-09-08 DIAGNOSIS — R300 Dysuria: Secondary | ICD-10-CM | POA: Diagnosis not present

## 2019-09-12 ENCOUNTER — Encounter: Payer: Self-pay | Admitting: Gastroenterology

## 2019-09-17 ENCOUNTER — Ambulatory Visit (INDEPENDENT_AMBULATORY_CARE_PROVIDER_SITE_OTHER): Payer: BC Managed Care – PPO

## 2019-09-17 ENCOUNTER — Other Ambulatory Visit: Payer: Self-pay | Admitting: Gastroenterology

## 2019-09-17 DIAGNOSIS — Z1159 Encounter for screening for other viral diseases: Secondary | ICD-10-CM | POA: Diagnosis not present

## 2019-09-17 LAB — SARS CORONAVIRUS 2 (TAT 6-24 HRS): SARS Coronavirus 2: NEGATIVE

## 2019-09-19 ENCOUNTER — Other Ambulatory Visit: Payer: Self-pay

## 2019-09-19 ENCOUNTER — Ambulatory Visit (AMBULATORY_SURGERY_CENTER): Payer: BC Managed Care – PPO | Admitting: Gastroenterology

## 2019-09-19 ENCOUNTER — Encounter: Payer: Self-pay | Admitting: Gastroenterology

## 2019-09-19 VITALS — BP 118/77 | HR 79 | Temp 97.3°F | Resp 16 | Ht 72.0 in | Wt 200.0 lb

## 2019-09-19 DIAGNOSIS — K219 Gastro-esophageal reflux disease without esophagitis: Secondary | ICD-10-CM

## 2019-09-19 DIAGNOSIS — K635 Polyp of colon: Secondary | ICD-10-CM

## 2019-09-19 DIAGNOSIS — K2270 Barrett's esophagus without dysplasia: Secondary | ICD-10-CM | POA: Diagnosis not present

## 2019-09-19 DIAGNOSIS — D509 Iron deficiency anemia, unspecified: Secondary | ICD-10-CM

## 2019-09-19 DIAGNOSIS — K2950 Unspecified chronic gastritis without bleeding: Secondary | ICD-10-CM | POA: Diagnosis not present

## 2019-09-19 DIAGNOSIS — D122 Benign neoplasm of ascending colon: Secondary | ICD-10-CM | POA: Diagnosis not present

## 2019-09-19 DIAGNOSIS — K649 Unspecified hemorrhoids: Secondary | ICD-10-CM | POA: Diagnosis not present

## 2019-09-19 DIAGNOSIS — D123 Benign neoplasm of transverse colon: Secondary | ICD-10-CM | POA: Diagnosis not present

## 2019-09-19 DIAGNOSIS — K317 Polyp of stomach and duodenum: Secondary | ICD-10-CM

## 2019-09-19 DIAGNOSIS — D120 Benign neoplasm of cecum: Secondary | ICD-10-CM | POA: Diagnosis not present

## 2019-09-19 MED ORDER — SODIUM CHLORIDE 0.9 % IV SOLN: 500.0000 mL | Freq: Once | INTRAVENOUS | Status: DC

## 2019-09-19 MED ORDER — OMEPRAZOLE 40 MG PO CPDR: 40.0000 mg | DELAYED_RELEASE_CAPSULE | Freq: Two times a day (BID) | ORAL | 2 refills | Status: DC

## 2019-09-19 NOTE — Op Note (Signed)
[facility] Patient Name: Edward Nelson Procedure Date: 09/19/2019 10:34 AM MRN: [id number] Endoscopist: Thornton Park MD, MD Age: 54 Referring MD:  Date of Birth: Mar 11, 1966 Gender: Male Account #: [id number] Procedure:                Upper GI endoscopy Indications:              Iron deficiency anemia, Suspected esophageal reflux                           Reflux on omeprazole                           Iron deficiency without anemia Medicines:                Monitored Anesthesia Care Procedure:                Pre-Anesthesia Assessment:                           - Prior to the procedure, a History and Physical                            was performed, and patient medications and                            allergies were reviewed. The patient's tolerance of                            previous anesthesia was also reviewed. The risks                            and benefits of the procedure and the sedation                            options and risks were discussed with the patient.                            All questions were answered, and informed consent                            was obtained. Prior Anticoagulants: The patient has                            taken no previous anticoagulant or antiplatelet                            agents. ASA Grade Assessment: II - A patient with                            mild systemic disease. After reviewing the risks                            and benefits, the patient was deemed in  satisfactory condition to undergo the procedure.                           After obtaining informed consent, the endoscope was                            passed under direct vision. Throughout the                            procedure, the patient's blood pressure, pulse, and                            oxygen saturations were monitored continuously. The                            Endoscope was introduced through the mouth, and                            advanced to the third part of duodenum. The upper                            GI endoscopy was accomplished without difficulty.                            The patient tolerated the procedure well. Scope In: Scope Out: Findings:                 The examined esophagus was normal. Biopsies were                            taken from the proximal/mid and distal esophagus                            with a cold forceps for histology. Estimated blood                            loss was minimal.                           Diffuse moderate inflammation characterized by                            erythema, friability, granularity and linear                            erosions was found in the gastric body and in the                            gastric antrum. Biopsies were taken from the                            antrum, body, and fundus with a cold forceps for                            histology.  Estimated blood loss was minimal.                           Multiple medium sessile polyps were found in the                            gastric fundus and in the gastric body. Biopsies                            were taken with a cold forceps for histology.                            Estimated blood loss was minimal.                           Diffuse mildly erythematous mucosa without active                            bleeding and with no stigmata of bleeding was found                            in the duodenal bulb. Biopsies were taken with a                            cold forceps for histology. Estimated blood loss                            was minimal. Complications:            No immediate complications. Estimated blood loss:                            Minimal. Estimated Blood Loss:     Estimated blood loss was minimal. Impression:               - Normal esophagus. Biopsied.                           - Gastritis. Biopsied.                           - Multiple gastric polyps.  Biopsied.                           - Erythematous duodenopathy. Biopsied. Recommendation:           - Patient has a contact number available for                            emergencies. The signs and symptoms of potential                            delayed complications were discussed with the                            patient. Return to normal activities tomorrow.  Written discharge instructions were provided to the                            patient.                           - Resume previous diet.                           - Continue present medications. Increase omeprazole                            to 40 mg BID.                           - No aspirin, ibuprofen, naproxen, or other                            non-steroidal anti-inflammatory drugs.                           - Await pathology results.                           - Proceed with colonoscopy today as previously                            planned. Thornton Park MD, MD 09/19/2019 11:21:48 AM This report has been signed electronically.

## 2019-09-19 NOTE — Progress Notes (Signed)
Called to room to assist during endoscopic procedure.  Patient ID and intended procedure confirmed with present staff. Received instructions for my participation in the procedure from the performing physician.  

## 2019-09-19 NOTE — Patient Instructions (Signed)
HANDOUTS PROVIDED ON: GASTRITIS, POLYPS, & HEMORRHOIDS  The polyps removed/biopsies taken today have been sent for pathology.  The results can take 1-3 weeks to receive.  When your next colonoscopy should occur will be based on the pathology results.    You may resume your previous diet and medication schedule.  No aspirin, ibuprofen, naproxen, or other NSAIDs.  Thank you for allowing [place] to care for you today!!!   YOU HAD AN ENDOSCOPIC PROCEDURE TODAY AT [facility]:   Refer to the procedure report that was given to you for any specific questions about what was found during the examination.  If the procedure report does not answer your questions, please call your gastroenterologist to clarify.  If you requested that your care partner not be given the details of your procedure findings, then the procedure report has been included in a sealed envelope for you to review at your convenience later.  YOU SHOULD EXPECT: Some feelings of bloating in the abdomen. Passage of more gas than usual.  Walking can help get rid of the air that was put into your GI tract during the procedure and reduce the bloating. If you had a lower endoscopy (such as a colonoscopy or flexible sigmoidoscopy) you may notice spotting of blood in your stool or on the toilet paper. If you underwent a bowel prep for your procedure, you may not have a normal bowel movement for a few days.  Please Note:  You might notice some irritation and congestion in your nose or some drainage.  This is from the oxygen used during your procedure.  There is no need for concern and it should clear up in a day or so.  SYMPTOMS TO REPORT IMMEDIATELY:   Following lower endoscopy (colonoscopy or flexible sigmoidoscopy):  Excessive amounts of blood in the stool  Significant tenderness or worsening of abdominal pains  Swelling of the abdomen that is new, acute  Fever of 100F or higher   Following upper endoscopy (EGD)  Vomiting of  blood or coffee ground material  New chest pain or pain under the shoulder blades  Painful or persistently difficult swallowing  New shortness of breath  Fever of 100F or higher  Black, tarry-looking stools  For urgent or emergent issues, a gastroenterologist can be reached at any hour by calling [phone]. Do not use MyChart messaging for urgent concerns.    DIET:  We do recommend a small meal at first, but then you may proceed to your regular diet.  Drink plenty of fluids but you should avoid alcoholic beverages for 24 hours.  ACTIVITY:  You should plan to take it easy for the rest of today and you should NOT DRIVE or use heavy machinery until tomorrow (because of the sedation medicines used during the test).    FOLLOW UP: Our staff will call the number listed on your records 48-72 hours following your procedure to check on you and address any questions or concerns that you may have regarding the information given to you following your procedure. If we do not reach you, we will leave a message.  We will attempt to reach you two times.  During this call, we will ask if you have developed any symptoms of COVID 19. If you develop any symptoms (ie: fever, flu-like symptoms, shortness of breath, cough etc.) before then, please call [phone].  If you test positive for Covid 19 in the 2 weeks post procedure, please call and report this information to [place].  If any biopsies were taken you will be contacted by phone or by letter within the next 1-3 weeks.  Please call us at [phone] if you have not heard about the biopsies in 3 weeks.    SIGNATURES/CONFIDENTIALITY: You and/or your care partner have signed paperwork which will be entered into your electronic medical record.  These signatures attest to the fact that that the information above on your After Visit Summary has been reviewed and is understood.  Full responsibility of the confidentiality of this discharge information lies  with you and/or your care-partner.

## 2019-09-19 NOTE — Progress Notes (Signed)
Vitals-DT  History reviewed. 

## 2019-09-19 NOTE — Op Note (Signed)
Edward Nelson: Edward Nelson Procedure Date: 09/19/2019 10:34 AM MRN: [id number] Endoscopist: Thornton Park MD, MD Age: 54 Referring MD:  Date of Birth: 04/13/1966 Gender: Male Account #: [id number] Procedure:                Colonoscopy Indications:              Iron deficiency without anemia                           History of colon polyps on a colonoscopy in [place] in 2000                           Hemorrhoids status post hemorrhoidectomy                           No known family history of colon cancer or polyps Medicines:                Monitored Anesthesia Care Procedure:                Pre-Anesthesia Assessment:                           - Prior to the procedure, a History and Physical                            was performed, and patient medications and                            allergies were reviewed. The patient's tolerance of                            previous anesthesia was also reviewed. The risks                            and benefits of the procedure and the sedation                            options and risks were discussed with the patient.                            All questions were answered, and informed consent                            was obtained. Prior Anticoagulants: The patient has                            taken no previous anticoagulant or antiplatelet                            agents. ASA Grade Assessment: II - A patient with  mild systemic disease. After reviewing the risks                            and benefits, the patient was deemed in                            satisfactory condition to undergo the procedure.                           After obtaining informed consent, the colonoscope                            was passed under direct vision. Throughout the                            procedure, the patient's blood pressure, pulse, and      oxygen saturations were monitored continuously. The                            Colonoscope was introduced through the anus and                            advanced to the 3 cm into the ileum. A second                            forward view of the right colon was performed. The                            colonoscopy was performed without difficulty. The                            patient tolerated the procedure well. The quality                            of the bowel preparation was good. The terminal                            ileum, ileocecal valve, appendiceal orifice, and                            rectum were photographed. Scope In: 10:57:29 AM Scope Out: 11:12:50 AM Scope Withdrawal Time: 0 hours 13 minutes 16 seconds  Total Procedure Duration: 0 hours 15 minutes 21 seconds  Findings:                 Hemorrhoids were found on perianal exam.                           Non-bleeding internal hemorrhoids were found. The                            hemorrhoids were small.                           A less than 1  mm polyp was found in the ileocecal                            valve. The polyp was flat. The polyp was removed                            with a cold biopsy forceps. Resection and retrieval                            were complete. Estimated blood loss: none.                           Two sessile polyps were found in the ascending                            colon. The polyps were 2 to 3 mm in size. These                            polyps were removed with a cold snare. Resection                            and retrieval were complete. Estimated blood loss                            was minimal.                           A 2 mm polyp was found in the hepatic flexure. The                            polyp was flat. The polyp was removed with a cold                            snare. Resection and retrieval were complete.                            Estimated blood loss was minimal.                            The exam was otherwise without abnormality on                            direct and retroflexion views. Complications:            No immediate complications. Estimated blood loss:                            Minimal. Estimated Blood Loss:     Estimated blood loss was minimal. Impression:               - Hemorrhoids found on perianal exam.                           - Non-bleeding internal hemorrhoids.                           -  One less than 1 mm polyp at the ileocecal valve,                            removed with a cold biopsy forceps. Resected and                            retrieved.                           - Two 2 to 3 mm polyps in the ascending colon,                            removed with a cold snare. Resected and retrieved.                           - One 2 mm polyp at the hepatic flexure, removed                            with a cold snare. Resected and retrieved.                           - The examination was otherwise normal on direct                            and retroflexion views. Recommendation:           - Patient has a contact number available for                            emergencies. The signs and symptoms of potential                            delayed complications were discussed with the                            patient. Return to normal activities tomorrow.                            Written discharge instructions were provided to the                            patient.                           - Resume previous diet.                           - Continue present medications.                           - Await pathology results.                           - Repeat colonoscopy date to be determined after  pending pathology results are reviewed for                            surveillance.                           - Emerging evidence supports eating a diet of                            fruits, vegetables,  grains, calcium, and yogurt                            while reducing red meat and alcohol may reduce the                            risk of colon cancer.                           - Thank you for allowing me to be involved in your                            colon cancer prevention. Thornton Park MD, MD 09/19/2019 11:26:20 AM This report has been signed electronically.

## 2019-09-19 NOTE — Progress Notes (Signed)
Report given to PACU, vss 

## 2019-09-23 ENCOUNTER — Telehealth: Payer: Self-pay

## 2019-09-23 NOTE — Telephone Encounter (Signed)
Left message for pt to call back  °

## 2019-09-23 NOTE — Telephone Encounter (Signed)
Please call the patient to see if he is feeling any better. If not, let's try to get him in for an appointment with me or an APP this week. Thank you.

## 2019-09-23 NOTE — Telephone Encounter (Signed)
  Follow up Call-  Call back number 09/19/2019  Post procedure Call Back phone  # [phone]  Permission to leave phone message Yes  Some recent data might be hidden     Patient questions:  Do you have a fever, pain , or abdominal swelling? Yes.   Pain Score  2 * pt c/o stomach pain that has been going on since procedure.  Says when he eats he feels like things are "having problems digesting"  Says it hasn't gotten better or worse.  RN advised patient to use the telephone number provided at time of d/c if pain continues or gets worse.  Will inform Dr. Tarri Glenn of this conversation.  Have you tolerated food without any problems? No.  See note above  Have you been able to return to your normal activities? No.  Do you have any questions about your discharge instructions: Diet   No. Medications  No. Follow up visit  No.  Do you have questions or concerns about your Care? No.  Actions: * If pain score is 4 or above:   Physician/ provider Notified : Joelene Millin L. Beavers, MD.  1. Have you developed a fever since your procedure? no  2.   Have you had an respiratory symptoms (SOB or cough) since your procedure? no  3.   Have you tested positive for COVID 19 since your procedure no  4.   Have you had any family members/close contacts diagnosed with the COVID 19 since your procedure?  no   If yes to any of these questions please route to Joylene John, RN and Erenest Rasher, RN

## 2019-09-24 NOTE — Telephone Encounter (Signed)
Pt states he is much better now and does not need to be seen.

## 2019-09-25 ENCOUNTER — Encounter: Payer: Self-pay | Admitting: Gastroenterology

## 2019-10-03 NOTE — Telephone Encounter (Signed)
Dr. Beavers notified. 

## 2019-10-13 ENCOUNTER — Telehealth: Payer: Self-pay | Admitting: Family Medicine

## 2019-10-13 NOTE — Telephone Encounter (Signed)
Pt said he could not get the Covid vaccine because he is allergic to some of the ingredients and wants to know there is an alternative shot he can have  . please call [phone]. if he does not answer, you may leave a message

## 2019-10-13 NOTE — Telephone Encounter (Signed)
No alternative shot to prevent from COVID, but allergy to Tetanus vaccine might not be a complete contraindication. We can refer to allergy if he would like to test out components and see if he actually might be eligible for one of the COVID vaccines if he would like?

## 2019-10-14 ENCOUNTER — Encounter: Payer: Self-pay | Admitting: Family Medicine

## 2019-10-14 NOTE — Telephone Encounter (Signed)
Please advise 

## 2019-10-15 ENCOUNTER — Other Ambulatory Visit: Payer: Self-pay | Admitting: Family Medicine

## 2019-10-15 DIAGNOSIS — T8069XA Other serum reaction due to other serum, initial encounter: Secondary | ICD-10-CM

## 2019-10-15 NOTE — Telephone Encounter (Signed)
Referral placed.

## 2019-10-15 NOTE — Telephone Encounter (Signed)
Patient notified of update  and verbalized understanding. Pt would like to see an allergist.

## 2019-10-17 ENCOUNTER — Telehealth: Payer: Self-pay | Admitting: Allergy

## 2019-10-17 NOTE — Telephone Encounter (Signed)
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 Patient would like to get the COVID vaccine, but [facility] would not give him the vaccine due to his allergy. Patient would like to know what to do.  Patient would like to be seen in [facility].  Please advise if testing is needed or just an office visit to discuss.

## 2019-10-20 NOTE — Telephone Encounter (Signed)
Patient scheduled 10/30/2019 in [place] at 8:30am. Patient informed that he will have an office visit first which will determine if component testing is needed. Patient informed to stay off antihistamines 3 days prior to this appointment and that he may be in the office 3 hours if component testing is done.

## 2019-10-20 NOTE — Telephone Encounter (Signed)
He needs an office visit and depending on what type of reaction he had will discuss whether component testing is needed. I have opening on July 1st at the [facility] [place] office at 8:30AM where he can come in and have both done if needed. Otherwise, I don't really have any [facility] openings at 8:30AM.   I could to a televisit this Wednesday to get history of reaction and then schedule for component testing if necessary.  Thank you.

## 2019-10-30 ENCOUNTER — Ambulatory Visit: Payer: BC Managed Care – PPO | Admitting: Allergy

## 2019-10-30 ENCOUNTER — Encounter: Payer: Self-pay | Admitting: Allergy

## 2019-10-30 ENCOUNTER — Other Ambulatory Visit: Payer: Self-pay

## 2019-10-30 VITALS — BP 120/70 | HR 81 | Temp 98.3°F | Resp 16 | Wt 193.8 lb

## 2019-10-30 DIAGNOSIS — T50B95D Adverse effect of other viral vaccines, subsequent encounter: Secondary | ICD-10-CM | POA: Diagnosis not present

## 2019-10-30 DIAGNOSIS — L299 Pruritus, unspecified: Secondary | ICD-10-CM | POA: Diagnosis not present

## 2019-10-30 DIAGNOSIS — R21 Rash and other nonspecific skin eruption: Secondary | ICD-10-CM

## 2019-10-30 NOTE — Assessment & Plan Note (Addendum)
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 History of hives after tetanus injection as a child. Since then tolerated pneumonia, flu and cortisone injections with no issues. Underwent colonoscopy with prep with no issues.  Today's skin testing was borderline positive to triamcinolone which has polysorbate 80.   Discussed the risks and benefits of COVID-19 vaccination. No history of anaphylactic or severe reactions to vaccines, colonoscopy prep or Miralax in the past. There is a history of reaction to tetanus and positive patch testing to propylene glycol.   His risk of having an IgE mediated allergic reaction to Moderna/Pfizer vaccine is low given clinical history and testing results.  Recommend getting Moderna or Pfizer vaccine which do no contain polysorbate 80.  Premedicate with zyrtec 10mg  1-2 hours before vaccination.  Have someone else drive you there.  Wait 30 minutes after injections versus the 15 minute requirement.  Please get vaccine at the [facility] as there are more support staff at that location than the local pharmacy.   For mild symptoms you can take over the counter antihistamines such as Benadryl and monitor symptoms closely. If symptoms worsen or if you have severe symptoms including breathing issues, throat closure, significant swelling, whole body hives, severe diarrhea and vomiting, lightheadedness then seek immediate medical care.

## 2019-10-30 NOTE — Patient Instructions (Signed)
Discussed the risks and benefits of COVID-19 vaccination. No history of anaphylactic or severe reactions to vaccines, colonoscopy prep or miralax in the past. There is a history of reaction to tetanus and positive patch testing to propylene glycol.  Today's skin testing was borderline positive to triamcinolone which has polysorbate 80.    Recommend getting Moderna or Pfizer vaccine which do no contain polysorbate 80.  Premedicate with zyrtec 10mg  1-2 hours before vaccination.  Have someone else drive you there.  Wait 30 minutes after injections versus the 15 minute requirement.  Please get vaccine at the [facility] as there are more support staff at that location than the local pharmacy.   For mild symptoms you can take over the counter antihistamines such as Benadryl and monitor symptoms closely. If symptoms worsen or if you have severe symptoms including breathing issues, throat closure, significant swelling, whole body hives, severe diarrhea and vomiting, lightheadedness then seek immediate medical care.  Follow up as needed.

## 2019-10-30 NOTE — Progress Notes (Signed)
Follow Up Note  RE: Edward Nelson MRN: [id number] DOB: 18-Dec-1965 Date of Office Visit: 10/30/2019  Referring provider: Caren Macadam, MD Primary care provider: Caren Macadam, MD  Chief Complaint: Food/Drug Challenge (covid component)  History of Present Illness: I had the pleasure of seeing Edward Nelson for a follow up visit at the [facility] [place] [facility] [place] on 10/30/2019. He is a 54 y.o. male, who is being followed for allergic rhinitis, heartburn, pruritic rash and adverse drug reaction. His previous allergy office visit was on 06/16/2019 with Dr. Maudie Nelson. Today is a new complaint visit of COVID-19 vaccine reaction.  Any known reactions to polyethylene glycol or polysorbate?  Patient was positive to propylene glycol via patch testing - this was done due to persistent rash of unknown cause.   Any history of anaphylaxis to vaccinations? Patient broke out in hives after receiving the tetanus injection as a young child. Has been avoiding it since then.   No issues with pneumonia and flu shots.   Any history of reactions to injectable medications? No reactions to cortisone injections in the past - last one was given about 7 years ago.  Any history of anaphylaxis to colonoscopy preps (i.e.Miralax)? No issues with colonoscopy or Miralax.   Any history of dermal filler treatments in the last year? No.  Patient went to [facility] to get vaccine but was turned away due to his history of the positive patch testing for propylene glycol. Patient interested in receiving his injection as his employer is removing mask mandates for fully vaccinated employees.   Other allergic rhinitis Having sinus drainage. Taking dymista with some benefit.   Assessment and Plan: Edward Nelson is a 54 y.o. male with: Adverse effect of other viral vaccines, subsequent encounter Turned away from vaccine site due to positive patch testing for propylene glycol. History of hives after tetanus injection  as a child. Since then tolerated pneumonia, flu and cortisone injections with no issues. Underwent colonoscopy with prep with no issues.  Today's skin testing was borderline positive to triamcinolone which has polysorbate 80.   Discussed the risks and benefits of COVID-19 vaccination. No history of anaphylactic or severe reactions to vaccines, colonoscopy prep or Miralax in the past. There is a history of reaction to tetanus and positive patch testing to propylene glycol.   His risk of having an IgE mediated allergic reaction to Moderna/Pfizer vaccine is low given clinical history and testing results.  Recommend getting Moderna or Pfizer vaccine which do no contain polysorbate 80.  Premedicate with zyrtec 10mg  1-2 hours before vaccination.  Have someone else drive you there.  Wait 30 minutes after injections versus the 15 minute requirement.  Please get vaccine at the [facility] as there are more support staff at that location than the local pharmacy.   For mild symptoms you can take over the counter antihistamines such as Benadryl and monitor symptoms closely. If symptoms worsen or if you have severe symptoms including breathing issues, throat closure, significant swelling, whole body hives, severe diarrhea and vomiting, lightheadedness then seek immediate medical care.  Return if symptoms worsen or fail to improve.  Diagnostics: Skin Testing: see below. Results discussed with patient/family.  COVID Vaccine Testing - 10/30/19 1033      Test Information   Consent Yes    Medications Triamcinolone    Triamcinolone Lot # [id number]    Triamcinolone EXP DATE 03/31/20    Miralax Lot # [id number]    Miralax EXP DATE 12/30/20  Methylprednisolone Lot # [id number]    Methylprednisolone EXP DATE 10/29/19      Pre Test Vitals   BP 120/70    Pulse 81    Resp 16      SKIN PRICK TESTING - Arm #1   Location Right Arm    [facility]      HISTAMINE (1mg /mL) Skin Prick Arm #1   Histamine Time  Testing Placed 0915    Histamine Wheal 2+      Control (negative - HSA) Skin Prick Arm #1   Control Time Testing Placed 0915    Control Wheal Negative      Triamcinolone (40mg /mL) Skin Prick Arm #1   Triamcinolone Time Testing Placed 0915    Triamcinolone Wheal Negative      Methylprednisolone (40mg /mL) Skin Prick Arm #1   Methylprednisolone Time Testing Placed 0915    Methylprednisolone Wheal Negative      Miralax (1:100 or 1.7 mg/mL) Skin Prick Arm #1   Miralax Time Testing Placed 0915    Miralax Wheal Negative      Miralax (1:10 or 17mg /mL) Skin Prick Arm #1   Miralax Time Testing Placed 0915    Miralax Wheal Negative      Miralax (1:1 or 170mg /mL) Skin Prick Arm #1   Miralax Time Testing Placed 0915    Miralax Wheal Negative      INTRADERMAL TESTING - Arm #2   Location Left Arm    [facility]      Control (negative - HSA) Intradermal Arm #2   Control Time Testing Placed  0940    Control Wheal Negative      Triamcinolone (1:100) Intradermal Arm #2   Triamcinolone Time Testing Placed 0940    Triamcinolone Wheal Other   IR     Methylprednisolone (1:100) Intradermal Arm #2   Methylprednisolone Time Testing Placed  0940    Methylprednisolone Wheal Other   IR     Triamcinolone (1:10) Intradermal Arm #2   Triamcinolone Time Testing Placed 1000    Triamcinolone Wheal Negative   IR     Methylprednisolone (1:10) Intradermal Arm #2   Methylprednisolone Time Testing Placed  1000    Methylprednisolone Wheal Negative      Triamcinolone (1:1) Intradermal Arm #2   Triamcinolone Time Testing Placed 1017    Triamcinolone Wheal Other   +/-     Skin Prick/Intradermal Post Testing   Skin Prick/Intradermal Testing Total Pricks 13           Medication List:  Current Outpatient Medications  Medication Sig Dispense Refill  . Azelastine-Fluticasone 137-50 MCG/ACT SUSP Place 1 spray into the nose in the morning and at bedtime. 23 g 5  . desonide (DESOWEN) 0.05 % cream  Apply to affected areas twice a day until smooth. For face, nipples, genitals.    . fexofenadine (ALLEGRA) 180 MG tablet Take by mouth.    . halobetasol (ULTRAVATE) 0.05 % cream Apply topically.    . Multiple Vitamin (MULTIVITAMIN) capsule Take 1 capsule by mouth daily.    Marland Kitchen omeprazole (PRILOSEC) 40 MG capsule Take 1 capsule (40 mg total) by mouth 2 (two) times daily before a meal. 60 capsule 2  . tadalafil (CIALIS) 5 MG tablet TAKE ONE TABLET BY MOUTH ONCE DAILY     No current facility-administered medications for this visit.   Allergies: Allergies  Allergen Reactions  . Cayenne   . Penicillins   . Tetanus Toxoid    I reviewed his past medical history,  social history, family history, and environmental history and no significant changes have been reported from his previous visit.  Review of Systems  Constitutional: Negative for appetite change, chills, fever and unexpected weight change.  HENT: Positive for congestion, postnasal drip and rhinorrhea.   Eyes: Negative for itching.  Respiratory: Negative for cough, chest tightness, shortness of breath and wheezing.   Cardiovascular: Negative for chest pain.  Gastrointestinal: Negative for abdominal pain.  Genitourinary: Negative for difficulty urinating.  Skin: Positive for rash.  Allergic/Immunologic: Positive for environmental allergies. Negative for food allergies.  Neurological: Negative for headaches.   Objective: BP 120/70   Pulse 81   Temp 98.3 F (36.8 C) (Temporal)   Resp 16   Wt 193 lb 12.8 oz (87.9 kg)   SpO2 96%   BMI 26.28 kg/m  Body mass index is 26.28 kg/m. Physical Exam Vitals and nursing note reviewed.  Constitutional:      Appearance: He is well-developed.  HENT:     Head: Normocephalic and atraumatic.     Right Ear: External ear normal.     Left Ear: External ear normal.     Nose: Nose normal.     Mouth/Throat:     Mouth: Mucous membranes are moist.     Pharynx: Oropharynx is clear.  Eyes:      Conjunctiva/sclera: Conjunctivae normal.  Cardiovascular:     Rate and Rhythm: Normal rate and regular rhythm.     Heart sounds: Normal heart sounds. No murmur heard.  No friction rub. No gallop.   Pulmonary:     Effort: Pulmonary effort is normal.     Breath sounds: Normal breath sounds. No wheezing or rales.  Musculoskeletal:     Cervical back: Neck supple.  Skin:    General: Skin is warm.  Neurological:     Mental Status: He is alert and oriented to person, place, and time.  Psychiatric:        Behavior: Behavior normal.    Previous notes and tests were reviewed. The plan was reviewed with the patient/family, and all questions/concerned were addressed.  It was my pleasure to see Edward Nelson today and participate in his care. Please feel free to contact me with any questions or concerns.  Sincerely,  Rexene Alberts, DO Allergy & Immunology  [facility] office: [phone] [facility] office: [place] office: [phone]

## 2019-11-06 DIAGNOSIS — L723 Sebaceous cyst: Secondary | ICD-10-CM | POA: Diagnosis not present

## 2019-11-06 DIAGNOSIS — L720 Epidermal cyst: Secondary | ICD-10-CM | POA: Diagnosis not present

## 2019-11-19 ENCOUNTER — Ambulatory Visit (INDEPENDENT_AMBULATORY_CARE_PROVIDER_SITE_OTHER): Payer: BC Managed Care – PPO | Admitting: Gastroenterology

## 2019-11-19 ENCOUNTER — Encounter: Payer: Self-pay | Admitting: Gastroenterology

## 2019-11-19 ENCOUNTER — Other Ambulatory Visit (INDEPENDENT_AMBULATORY_CARE_PROVIDER_SITE_OTHER): Payer: BC Managed Care – PPO

## 2019-11-19 VITALS — BP 120/70 | HR 81 | Ht 73.0 in | Wt 193.0 lb

## 2019-11-19 DIAGNOSIS — M6289 Other specified disorders of muscle: Secondary | ICD-10-CM | POA: Diagnosis not present

## 2019-11-19 DIAGNOSIS — D509 Iron deficiency anemia, unspecified: Secondary | ICD-10-CM

## 2019-11-19 DIAGNOSIS — R194 Change in bowel habit: Secondary | ICD-10-CM

## 2019-11-19 LAB — HEMOGLOBIN: Hemoglobin: 15.5 g/dL (ref 13.0–17.0)

## 2019-11-19 LAB — IBC + FERRITIN
Ferritin: 99.2 ng/mL (ref 22.0–322.0)
Iron: 39 ug/dL — ABNORMAL LOW (ref 42–165)
Saturation Ratios: 12.8 % — ABNORMAL LOW (ref 20.0–50.0)
Transferrin: 217 mg/dL (ref 212.0–360.0)

## 2019-11-19 MED ORDER — HYDROCORTISONE ACETATE 25 MG RE SUPP
25.0000 mg | Freq: Two times a day (BID) | RECTAL | 1 refills | Status: DC
Start: 2019-11-19 — End: 2020-01-28

## 2019-11-19 MED ORDER — HYDROCORTISONE ACETATE 25 MG RE SUPP
25.0000 mg | Freq: Two times a day (BID) | RECTAL | 1 refills | Status: DC
Start: 2019-11-19 — End: 2019-11-19

## 2019-11-19 NOTE — Patient Instructions (Addendum)
Your provider has requested that you go to the basement level for lab work before leaving today. Press "B" on the elevator. The lab is located at the first door on the left as you exit the elevator.  Increase Metamucil twice daily, drink at least 64 ounces of water daily.   Use a squatty potty.   Continue Omeprazole 40mg  daily.   We have sent the following medications to your pharmacy for you to pick up at your convenience: Anusol HC Suppositories.     Due to recent COVID-19 restrictions implemented by our local and state authorities and in an effort to keep both patients and staff as safe as possible, our hospital system now requires COVID-19 testing prior to any scheduled hospital procedure. Please go to [facility], [address], [place] [address] on 01/31/20 at  10:30am. This is a drive up testing site, you will not need to exit your vehicle.  You will not be billed at the time of testing but may receive a bill later depending on your insurance. The approximate cost of the test is $100. You must agree to quarantine from the time of your testing until the procedure date on 02/04/2020 . This should include staying at home with ONLY the people you live with. Avoid take-out, grocery store shopping or leaving the house for any non-emergent reason. Failure to have your COVID-19 test done on the date and time you have been scheduled will result in cancellation of procedure. Please call our office at [phone] if you have any questions.    You have been scheduled to have an anorectal manometry at [facility] on 02/04/2020 at 12:30pm. Please arrive 30 minutes prior to your appointment time for registration (1st floor of the hospital-admissions).  Please make certain to use 1 Fleets enema 2 hours prior to coming for your appointment. You can purchase Fleets enemas from the laxative section at your drug store. You should not eat anything during the two hours prior to the procedure. You may take  regular medications with small sips of water at least 2 hours prior to the study.  Anorectal manometry is a test performed to evaluate patients with constipation or fecal incontinence. This test measures the pressures of the anal sphincter muscles, the sensation in the rectum, and the neural reflexes that are needed for normal bowel movements.  THE PROCEDURE The test takes approximately 30 minutes to 1 hour. You will be asked to change into a hospital gown. A technician or nurse will explain the procedure to you, take a brief health history, and answer any questions you may have. The patient then lies on his or her left side. A small, flexible tube, about the size of a thermometer, with a balloon at the end is inserted into the rectum. The catheter is connected to a machine that measures the pressure. During the test, the small balloon attached to the catheter may be inflated in the rectum to assess the normal reflex pathways. The nurse or technician may also ask the person to squeeze, relax, and push at various times. The anal sphincter muscle pressures are measured during each of these maneuvers. To squeeze, the patient tightens the sphincter muscles as if trying to prevent anything from coming out. To push or bear down, the patient strains down as if trying to have a bowel movement.    Follow- up with Dr.Nandigam for hemorrhoidal banding if no relief with Anusol Suppositories.   Thank you for choosing me and [facility].  Dr. Thornton Park

## 2019-11-19 NOTE — Progress Notes (Signed)
Referring Provider: Caren Macadam, MD Primary Care Physician:  Caren Macadam, MD  Reason for Consultation: Reflux   IMPRESSION:  Reflux on omeprazole with biopsies showing very short segment Barrett's Esophagus Iron deficiency without anemia, no overt bleeding Change in bowel habits following Urolift at [facility] - ? Pelvic floor dysfunction History of colon polyps    - patient reports polyps on a colonoscopy in [place] in 2000    - 3 tubular adenomas on colonoscopy 09/19/19    - surveillance recommended in 3 years Hemorrhoids status post hemorrhoidectomy with intermittent rectal bleeding No known family history of colon cancer or polyps  Unexplained iron deficiency: On oral iron supplements QOD. No source identified on EGD or colonoscopy.  Repeat labs today. If anemia present, proceed with capsule endoscopy.  Change in bowel habits following Urolift: Suspected pelvic floor dysfunction. Anorectal manometry +/- pelvic floor PT recommended. Add daily psyllium. Drink plenty of water.   History of colon polyps: Surveillance recommended in 3 years.  Reflux: Adequately controlled on omeprazole.   Barrett's Esophagus: Incidental finding on pathology. Would consider repeat EGD in 3 years. He is already on PPI therapy.   PLAN: - Iron, ferritin, hemoglobin - Increase Metamucil twice daily, drink at least 64 ounces of water daily - Use a squatty potty - Omeprazole 40 mg daily given his intestinal metaplasia - Anorectal manometry to evaluate for pelvic floor dyssnergia - Anusol HC suppositories PR BID for hemorrhoids - Follow-up with Dr. Silverio Decamp for hemorrhoidal banding at patient request (brochure provided today) - Followed by referral to pelvic floor PT if appropriate after reviewing the results of anorectal manometry - EGD in 3 years given the diagnosis of very short segment Barrett's - Colonoscopy in 3 years given the history of polyps  Please see the  "Patient Instructions" section for addition details about the plan.  HPI: Edward Nelson is a 54 y.o. male initially referred by Dr. Ethlyn Gallery for acid reflux.  The interval history is obtained through the patient and review of his electronic health record.  He has allergic rhinitis former smoker having quit in 2015. He has a history of hemorrhoids s/p hemorrhoidectomy, chronic prostatitis, type 3 burning mouth syndrome, and hypertension.  He has not had Covid or the Covid vaccine.   Has had reflux since the early 2000's.  An endoscopy in approximately 2004 was reportedly normal.  Symptoms have been controlled on omeprazole daily since that time.  Rare breakthrough symptoms triggered by citrus.   EGD for reflux and Colonoscopy for unclear reasons but potentially due to hemorrhoids in 2000 in [place].  Polyps were removed. Evaluated by Dr. Roney Mans in 2018. Endoscopy recommended but not every scheduled.   He was found to have iron deficiency on routine annual labs earlier this year. No associated symptoms. No overt bleeding.   Endoscopic evaluation 09/19/19 showed:  Colonoscopy: - Hemorrhoids found on perianal exam. - Non-bleeding internal hemorrhoids. - One less than 1 mm polyp at the ileocecal valve, removed with a cold biopsy forceps. Resected and retrieved. - Two 2 to 3 mm polyps in the ascending colon, removed with a cold snare. Resected and retrieved. - One 2 mm polyp at the hepatic flexure, removed with a cold snare. Resected and retrieved. - Three of the polyps were tubular adenomas. There was also uninvolved colon.  EGD: - Normal esophagus. Biopsied.Focal intestinal metaplasia consistent with Barrett's Esophagus seen on biopsies.  - Gastritis and reactive gastropathy. No H pylori.  - Multiple  fundic gland polyps. - Erythematous duodenopathy. Biopsied. Biopsies were normal.   No source of anemia identified on endoscopy. No follow-up anemia labs performed since earlier this year.    He is followed by [facility]. Recently had a Urolift procedure. Is having dysuria and difficulty with defecation following his Urolift procedure. He followed up with his urologist who recommended that he had an EGD and colonoscopy, however, these were just performed in May.   Having at least one bowel movement daily with a sense of incomplete evacuation. No blood or mucous.  Requested treatment for hemorrhoids due to intermittent blood on the toilet paper.   Father with oral cancer.  Mother has a large hiatal hernia. Brother with acid reflux. No known family history of colon cancer or polyps. No family history of uterine/endometrial cancer, pancreatic cancer or gastric/stomach cancer.   Past Medical History:  Diagnosis Date  . Enlarged prostate    treated by urologist with [facility]  . GERD (gastroesophageal reflux disease)     Past Surgical History:  Procedure Laterality Date  . BLADDER SURGERY  2010   Transurethral incision of bladder neck  . [place] SURGERY  2013  . CYSTOSCOPY WITH INSERTION OF UROLIFT    . KNEE ARTHROSCOPY Left   . NASAL SINUS SURGERY Left    started bleeding so didn't complete right side  . right leg fracture  1986   tibial/fib fracture with surgical correction, plate    Current Outpatient Medications  Medication Sig Dispense Refill  . Azelastine-Fluticasone 137-50 MCG/ACT SUSP Place 1 spray into the nose in the morning and at bedtime. 23 g 5  . desonide (DESOWEN) 0.05 % cream Apply to affected areas twice a day until smooth. For face, nipples, genitals.    . fexofenadine (ALLEGRA) 180 MG tablet Take by mouth.    . halobetasol (ULTRAVATE) 0.05 % cream Apply topically.    . Multiple Vitamin (MULTIVITAMIN) capsule Take 1 capsule by mouth daily.    Marland Kitchen omeprazole (PRILOSEC) 40 MG capsule Take 1 capsule (40 mg total) by mouth 2 (two) times daily before a meal. 60 capsule 2  . tadalafil (CIALIS) 5 MG tablet TAKE ONE TABLET BY MOUTH ONCE DAILY      No current facility-administered medications for this visit.    Allergies as of 11/19/2019 - Review Complete 11/19/2019  Allergen Reaction Noted  . [facility]  05/19/2019  . Penicillins    . Tetanus toxoid      Family History  Problem Relation Age of Onset  . Breast cancer Mother   . Osteoporosis Mother   . Hypertension Mother   . Arthritis Mother   . Cancer - Other Father        oral  . Breast cancer Sister   . Hypertension Sister   . Other Brother        acid reflux  . Colon cancer Neg Hx   . Rectal cancer Neg Hx   . Stomach cancer Neg Hx   . Esophageal cancer Neg Hx     Social History   Socioeconomic History  . Marital status: Single    Spouse name: Not on file  . Number of children: 0  . Years of education: Not on file  . Highest education level: Not on file  Occupational History  . Occupation: Fabrication  Tobacco Use  . Smoking status: Former Smoker    Packs/day: 1.00    Years: 35.00    Pack years: 35.00    Types: Cigarettes  Start date: 80    Quit date: 2015    Years since quitting: 6.5  . Smokeless tobacco: Never Used  Vaping Use  . Vaping Use: Never used  Substance and Sexual Activity  . Alcohol use: Yes    Comment: occasionally 1 per day  . Drug use: Never  . Sexual activity: Not Currently    Partners: Female  Other Topics Concern  . Not on file  Social History Narrative  . Not on file   Social Determinants of Health   Financial Resource Strain:   . Difficulty of Paying Living Expenses:   Food Insecurity:   . Worried About Charity fundraiser in the Last Year:   . Arboriculturist in the Last Year:   Transportation Needs:   . Film/video editor (Medical):   Marland Kitchen Lack of Transportation (Non-Medical):   Physical Activity:   . Days of Exercise per Week:   . Minutes of Exercise per Session:   Stress:   . Feeling of Stress :   Social Connections:   . Frequency of Communication with Friends and Family:   . Frequency of Social  Gatherings with Friends and Family:   . Attends Religious Services:   . Active Member of Clubs or Organizations:   . Attends Archivist Meetings:   Marland Kitchen Marital Status:   Intimate Partner Violence:   . Fear of Current or Ex-Partner:   . Emotionally Abused:   Marland Kitchen Physically Abused:   . Sexually Abused:    Physical Exam: General:   Alert,  well-nourished, pleasant and cooperative in NAD Head:  Normocephalic and atraumatic. Eyes:  Sclera clear, no icterus.   Conjunctiva pink. Abdomen:  Soft, nontender, nondistended, normal bowel sounds, no rebound or guarding. No hepatosplenomegaly.   Neurologic:  Alert and  oriented x4;  grossly nonfocal Skin:  Intact without significant lesions or rashes. Nails appear normal.  Psych:  Alert and cooperative. Normal mood and affect.    Edward Perea L. Tarri Glenn, MD, MPH 11/19/2019, 9:20 AM

## 2019-11-20 ENCOUNTER — Other Ambulatory Visit: Payer: Self-pay

## 2019-11-20 DIAGNOSIS — D509 Iron deficiency anemia, unspecified: Secondary | ICD-10-CM

## 2019-11-20 MED ORDER — SUTAB 1479-225-188 MG PO TABS: 1.0000 | ORAL_TABLET | Freq: Once | ORAL | 0 refills | Status: AC

## 2019-11-27 DIAGNOSIS — Z87438 Personal history of other diseases of male genital organs: Secondary | ICD-10-CM | POA: Diagnosis not present

## 2019-11-27 DIAGNOSIS — N401 Enlarged prostate with lower urinary tract symptoms: Secondary | ICD-10-CM | POA: Diagnosis not present

## 2019-11-27 DIAGNOSIS — R300 Dysuria: Secondary | ICD-10-CM | POA: Diagnosis not present

## 2019-11-27 DIAGNOSIS — R3914 Feeling of incomplete bladder emptying: Secondary | ICD-10-CM | POA: Diagnosis not present

## 2019-12-04 ENCOUNTER — Ambulatory Visit (INDEPENDENT_AMBULATORY_CARE_PROVIDER_SITE_OTHER): Payer: BC Managed Care – PPO | Admitting: Gastroenterology

## 2019-12-04 ENCOUNTER — Encounter: Payer: Self-pay | Admitting: Gastroenterology

## 2019-12-04 DIAGNOSIS — D509 Iron deficiency anemia, unspecified: Secondary | ICD-10-CM | POA: Diagnosis not present

## 2019-12-04 NOTE — Patient Instructions (Signed)
You may have clear liquids beginning at 10:30 am after ingesting the capsule.    At 1:00 pm mix 17 GMs of Miralax in 8 oz of liquid,  of your choice,  and drink. You may have a light lunch of half a sandwich and a bowl of soup. You may have a normal dinner after 5:00 pm.  No further dietary restrictions are necessary.  You should pass the capsule in your stool 8-48 hours after ingestion. If you have not passed the capsule, after 72 hours, please contact the office at [phone].  Please watch the retrieval video at [email or web address].  It is imperative to retrieve the capsule to avoid repeating the test.  Please keep your capsule retrieval kit with you at all times when visiting the restroom until you have retrieved the capsule.   Please follow the instructions for returning the capsule:  Place the retrieved capsule from the take-home retrieval kit in the provided vial.  Ensure the vial lid is locked and put the vial into the envelope, seal the pre-labeled envelope, and drop if off at any FedEx drop box or physical FedEx location.

## 2019-12-04 NOTE — Progress Notes (Signed)
The pt arrived for capsule endoscopy ingestion.  He confirmed the prep was successful.  He does tell me that he is allergic to miralax and was provided a sample sutab to prepare for the capsule. Due to the fact that he is allergic to miralax I spoke with Nicoletta Ba PA what he should do for the 1 pm dose.  She gave a verbal order that no further prep is needed.  I advised the pt on the instructions for the rest of today and told him he would not need to prep at 1 pm.  The pt has been advised of the information and verbalized understanding.   All instructions were discussed and the pt voiced understanding.  Capsule was removed and we waited for the capsule to activate and the pt swallowed with no issues.  He was provided the collection kit and asked to call with any issues.  He is aware to call if he has not passed the capsule in 72 hours.  The pt has been advised of the information and verbalized understanding.     Lot # [id number] SN# [id number] EXP 05-20-2021

## 2019-12-05 ENCOUNTER — Telehealth: Payer: Self-pay | Admitting: Gastroenterology

## 2019-12-05 NOTE — Telephone Encounter (Signed)
Pt is requesting a call back from a nurse to discuss the capsule he took yesterday.

## 2019-12-05 NOTE — Telephone Encounter (Signed)
The pt swallowed capsule yesterday and has no passed it as of today and wants to know if that is normal. I did advise that it can take up to 72 hours to pass and he should call back next week if he has not passed it over the weekend.

## 2019-12-23 ENCOUNTER — Other Ambulatory Visit: Payer: Self-pay

## 2019-12-23 DIAGNOSIS — D509 Iron deficiency anemia, unspecified: Secondary | ICD-10-CM

## 2019-12-29 ENCOUNTER — Telehealth: Payer: Self-pay | Admitting: Hematology & Oncology

## 2019-12-29 NOTE — Telephone Encounter (Signed)
Spoke with patient to confirm new patient appointment 9/29 at 1030 am. patient will reference MyChart for date/time/loction.

## 2020-01-20 ENCOUNTER — Encounter: Payer: Self-pay | Admitting: Plastic Surgery

## 2020-01-20 ENCOUNTER — Other Ambulatory Visit: Payer: Self-pay

## 2020-01-20 ENCOUNTER — Ambulatory Visit (INDEPENDENT_AMBULATORY_CARE_PROVIDER_SITE_OTHER): Payer: BC Managed Care – PPO | Admitting: Plastic Surgery

## 2020-01-20 DIAGNOSIS — L989 Disorder of the skin and subcutaneous tissue, unspecified: Secondary | ICD-10-CM

## 2020-01-20 DIAGNOSIS — L723 Sebaceous cyst: Secondary | ICD-10-CM | POA: Diagnosis not present

## 2020-01-20 NOTE — Progress Notes (Signed)
Patient ID: Edward Nelson, male    DOB: [birth year], 54 y.o.   MRN: [id number]   Chief Complaint  Patient presents with  . Advice Only  . Skin Problem    The patient is a 54 year old male here for evaluation of a mass behind his right ear and a changing skin lesion on his penis.  The patient has had the lesion behind his right ear drained in the past but it returned.  It may be a sebaceous cyst or an epidermoid cyst.  It is 1 cm in size and tender.  It gets larger and smaller depending on whether or not it drains.  Today it does not look infected and is not red but noticeable.  It is not movable.  It is slightly tender to touch.  There is a another lesion on his penis.  This has been cultured in the past and was found to be Staphylococcus lugdunensis.  He was treated with antibiotics for this.  It is located on the ventral right side.  It is 1 cm in size.  The patient is concerned because it is irritated and sometimes gets dry and bleeds.  He is otherwise and good health.   Review of Systems  Constitutional: Negative.   HENT: Negative.   Eyes: Negative.   Respiratory: Negative.   Cardiovascular: Negative.   Gastrointestinal: Negative.   Endocrine: Negative.   Genitourinary: Negative.   Musculoskeletal: Negative.   Neurological: Negative.   Hematological: Negative.   Psychiatric/Behavioral: Negative.     Past Medical History:  Diagnosis Date  . Enlarged prostate    treated by urologist with [facility]  . GERD (gastroesophageal reflux disease)     Past Surgical History:  Procedure Laterality Date  . BLADDER SURGERY  2010   Transurethral incision of bladder neck  . [place] SURGERY  2013  . CYSTOSCOPY WITH INSERTION OF UROLIFT    . KNEE ARTHROSCOPY Left   . NASAL SINUS SURGERY Left    started bleeding so didn't complete right side  . right leg fracture  1986   tibial/fib fracture with surgical correction, plate      Current Outpatient Medications:  .   desonide (DESOWEN) 0.05 % cream, Apply to affected areas twice a day until smooth. For face, nipples, genitals., Disp: , Rfl:  .  fexofenadine (ALLEGRA) 180 MG tablet, Take by mouth., Disp: , Rfl:  .  halobetasol (ULTRAVATE) 0.05 % cream, Apply topically., Disp: , Rfl:  .  Multiple Vitamin (MULTIVITAMIN) capsule, Take 1 capsule by mouth daily., Disp: , Rfl:  .  tadalafil (CIALIS) 5 MG tablet, TAKE ONE TABLET BY MOUTH ONCE DAILY, Disp: , Rfl:  .  Azelastine-Fluticasone 137-50 MCG/ACT SUSP, Place 1 spray into the nose in the morning and at bedtime., Disp: 23 g, Rfl: 5 .  hydrocortisone (ANUSOL-HC) 25 MG suppository, Place 1 suppository (25 mg total) rectally every 12 (twelve) hours., Disp: 12 suppository, Rfl: 1 .  omeprazole (PRILOSEC) 40 MG capsule, Take 1 capsule (40 mg total) by mouth 2 (two) times daily before a meal., Disp: 60 capsule, Rfl: 2   Objective:   Vitals:   01/20/20 0827  BP: (!) 161/83  Pulse: (!) 104  Temp: 98.7 F (37.1 C)  SpO2: 97%    Physical Exam Vitals and nursing note reviewed.  Constitutional:      Appearance: Normal appearance.  HENT:     Head:   Cardiovascular:     Rate and Rhythm: Normal  rate.     Pulses: Normal pulses.  Pulmonary:     Effort: Pulmonary effort is normal.  Genitourinary:   Musculoskeletal:     Cervical back: Normal range of motion.  Neurological:     General: No focal deficit present.     Mental Status: He is alert and oriented to person, place, and time.  Psychiatric:        Mood and Affect: Mood normal.        Behavior: Behavior normal.        Thought Content: Thought content normal.     Assessment & Plan:  Sebaceous cyst  Changing skin lesion  Recommend excision of cyst on the posterior right ear and the changing skin lesion on the penis.  Pictures were obtained of the patient and placed in the chart with the patient's or guardian's permission.   [name], DO

## 2020-01-28 ENCOUNTER — Encounter: Payer: Self-pay | Admitting: Hematology & Oncology

## 2020-01-28 ENCOUNTER — Inpatient Hospital Stay: Payer: BC Managed Care – PPO

## 2020-01-28 ENCOUNTER — Telehealth: Payer: Self-pay | Admitting: Hematology & Oncology

## 2020-01-28 ENCOUNTER — Inpatient Hospital Stay: Payer: BC Managed Care – PPO | Attending: Family | Admitting: Hematology & Oncology

## 2020-01-28 ENCOUNTER — Other Ambulatory Visit: Payer: Self-pay

## 2020-01-28 VITALS — BP 163/86 | HR 77 | Temp 98.2°F | Resp 18 | Wt 197.0 lb

## 2020-01-28 DIAGNOSIS — Z803 Family history of malignant neoplasm of breast: Secondary | ICD-10-CM | POA: Insufficient documentation

## 2020-01-28 DIAGNOSIS — D500 Iron deficiency anemia secondary to blood loss (chronic): Secondary | ICD-10-CM

## 2020-01-28 DIAGNOSIS — E611 Iron deficiency: Secondary | ICD-10-CM | POA: Insufficient documentation

## 2020-01-28 DIAGNOSIS — Z87891 Personal history of nicotine dependence: Secondary | ICD-10-CM | POA: Diagnosis not present

## 2020-01-28 DIAGNOSIS — Z8261 Family history of arthritis: Secondary | ICD-10-CM | POA: Insufficient documentation

## 2020-01-28 DIAGNOSIS — Z8249 Family history of ischemic heart disease and other diseases of the circulatory system: Secondary | ICD-10-CM | POA: Insufficient documentation

## 2020-01-28 DIAGNOSIS — Z79899 Other long term (current) drug therapy: Secondary | ICD-10-CM | POA: Insufficient documentation

## 2020-01-28 DIAGNOSIS — N400 Enlarged prostate without lower urinary tract symptoms: Secondary | ICD-10-CM | POA: Diagnosis not present

## 2020-01-28 DIAGNOSIS — K2270 Barrett's esophagus without dysplasia: Secondary | ICD-10-CM | POA: Insufficient documentation

## 2020-01-28 DIAGNOSIS — Z8262 Family history of osteoporosis: Secondary | ICD-10-CM | POA: Diagnosis not present

## 2020-01-28 DIAGNOSIS — K219 Gastro-esophageal reflux disease without esophagitis: Secondary | ICD-10-CM | POA: Diagnosis not present

## 2020-01-28 LAB — CMP (CANCER CENTER ONLY)
ALT: 21 U/L (ref 0–44)
AST: 18 U/L (ref 15–41)
Albumin: 4.4 g/dL (ref 3.5–5.0)
Alkaline Phosphatase: 71 U/L (ref 38–126)
Anion gap: 6 (ref 5–15)
BUN: 13 mg/dL (ref 6–20)
CO2: 28 mmol/L (ref 22–32)
Calcium: 10 mg/dL (ref 8.9–10.3)
Chloride: 104 mmol/L (ref 98–111)
Creatinine: 1.05 mg/dL (ref 0.61–1.24)
GFR, Est AFR Am: >60 mL/min (ref >60)
GFR, Estimated: >60 mL/min (ref >60)
Glucose, Bld: 141 mg/dL — ABNORMAL HIGH (ref 70–99)
Potassium: 3.6 mmol/L (ref 3.5–5.1)
Sodium: 138 mmol/L (ref 135–145)
Total Bilirubin: 0.5 mg/dL (ref 0.3–1.2)
Total Protein: 6.8 g/dL (ref 6.5–8.1)

## 2020-01-28 LAB — CBC WITH DIFFERENTIAL (CANCER CENTER ONLY)
Abs Immature Granulocytes: 0.05 10*3/uL (ref 0.00–0.07)
Basophils Absolute: 0 10*3/uL (ref 0.0–0.1)
Basophils Relative: 1 %
Eosinophils Absolute: 0.1 10*3/uL (ref 0.0–0.5)
Eosinophils Relative: 3 %
HCT: 48.4 % (ref 39.0–52.0)
Hemoglobin: 16.5 g/dL (ref 13.0–17.0)
Immature Granulocytes: 1 %
Lymphocytes Relative: 38 %
Lymphs Abs: 1.7 10*3/uL (ref 0.7–4.0)
MCH: 30.3 pg (ref 26.0–34.0)
MCHC: 34.1 g/dL (ref 30.0–36.0)
MCV: 88.8 fL (ref 80.0–100.0)
Monocytes Absolute: 0.4 10*3/uL (ref 0.1–1.0)
Monocytes Relative: 10 %
Neutro Abs: 2.1 10*3/uL (ref 1.7–7.7)
Neutrophils Relative %: 47 %
Platelet Count: 122 10*3/uL — ABNORMAL LOW (ref 150–400)
RBC: 5.45 MIL/uL (ref 4.22–5.81)
RDW: 12.3 % (ref 11.5–15.5)
WBC Count: 4.4 10*3/uL (ref 4.0–10.5)
nRBC: 0 % (ref 0.0–0.2)

## 2020-01-28 LAB — RETICULOCYTES
Immature Retic Fract: 6.1 % (ref 2.3–15.9)
RBC.: 5.37 MIL/uL (ref 4.22–5.81)
Retic Count, Absolute: 77.9 10*3/uL (ref 19.0–186.0)
Retic Ct Pct: 1.5 % (ref 0.4–3.1)

## 2020-01-28 LAB — SAVE SMEAR(SSMR), FOR PROVIDER SLIDE REVIEW

## 2020-01-28 NOTE — Telephone Encounter (Signed)
Appointments scheduled patient has My Chart per 9/29 los

## 2020-01-28 NOTE — Progress Notes (Signed)
Referral MD  Reason for Referral: Iron deficiency without anemia  No chief complaint on file. : I was told that my iron is low.  HPI: Edward Nelson is a really nice 54 year old white male.  He is originally from [place].  His mother still lives up there.  He goes up to [place] on occasion to visit her.  He lives in [place].  He works for a [employer].  He has been pretty healthy.  He is not had surgery.  He is to smoke.  He stopped several years ago.  He has an occasional beer.  He has had no bleeding.  He does have some hemorrhoids.  He does have reflux.  He did have an upper and lower endoscopy done back in May.  He had biopsies taken.  The pathology report ([id number]) shows Barrett's esophagus.  There is nothing that was malignant.  With his colonoscopy, he has some polyps.  They appear to be benign.  Back in February, he had iron studies done.  His ferritin was 125.  His iron saturation was 9.6.  At the same time, he had a vitamin B12 level of 400.  He was put on oral iron and oral vitamin B12.  In July, his ferritin was 99 with an iron saturation of 13%.  Based on the iron deficiency, he was kindly referred to the [facility] for an evaluation.  He is really fun to talk to.  We talked a lot about classic rock music of the 64s.  He has had really no fatigue or weakness.  He has had no rashes.  He has had no problems with mouth sores.  He has not had any kind of cravings for ice.  He has had no tingling in the hands or feet.  He does not have any cough or shortness of breath.  He has had the coronavirus vaccine.  Overall, his performance status is ECOG 0.    Past Medical History:  Diagnosis Date  . Enlarged prostate    treated by urologist with [facility]  . GERD (gastroesophageal reflux disease)   :  Past Surgical History:  Procedure Laterality Date  . BLADDER SURGERY  2010   Transurethral  incision of bladder neck  . [place] SURGERY  2013  . CYSTOSCOPY WITH INSERTION OF UROLIFT    . KNEE ARTHROSCOPY Left   . NASAL SINUS SURGERY Left    started bleeding so didn't complete right side  . right leg fracture  1986   tibial/fib fracture with surgical correction, plate  :   Current Outpatient Medications:  .  desonide (DESOWEN) 0.05 % cream, Apply to affected areas twice a day until smooth. For face, nipples, genitals., Disp: , Rfl:  .  fexofenadine (ALLEGRA) 180 MG tablet, Take by mouth., Disp: , Rfl:  .  halobetasol (ULTRAVATE) 0.05 % cream, Apply topically., Disp: , Rfl:  .  Multiple Vitamin (MULTIVITAMIN) capsule, Take 1 capsule by mouth daily., Disp: , Rfl:  .  omeprazole (PRILOSEC) 40 MG capsule, Take 1 capsule (40 mg total) by mouth 2 (two) times daily before a meal., Disp: 60 capsule, Rfl: 2 .  TADALAFIL PO, Take 6 mg by mouth as needed., Disp: , Rfl: :  :  Allergies  Allergen Reactions  . Cayenne Itching  . Penicillins Other (See Comments)    Not known Not known   . Polyethylene Glycol   . Shellac Other (See Comments)  Positive patch test Positive patch test Positive patch test Positive patch test   . Tetanus Toxoids Other (See Comments)    Not known Not known   . Tetanus Toxoid   :  Family History  Problem Relation Age of Onset  . Breast cancer Mother   . Osteoporosis Mother   . Hypertension Mother   . Arthritis Mother   . Cancer - Other Father        oral  . Breast cancer Sister   . Hypertension Sister   . Other Brother        acid reflux  . Colon cancer Neg Hx   . Rectal cancer Neg Hx   . Stomach cancer Neg Hx   . Esophageal cancer Neg Hx   :  Social History   Socioeconomic History  . Marital status: Single    Spouse name: Not on file  . Number of children: 0  . Years of education: Not on file  . Highest education level: Not on file  Occupational History  . Occupation: Fabrication  Tobacco Use  . Smoking status: Former  Smoker    Packs/day: 1.00    Years: 35.00    Pack years: 35.00    Types: Cigarettes    Start date: 32    Quit date: 2015    Years since quitting: 6.7  . Smokeless tobacco: Never Used  Vaping Use  . Vaping Use: Never used  Substance and Sexual Activity  . Alcohol use: Yes    Comment: occasionally 1 per day  . Drug use: Never  . Sexual activity: Not Currently    Partners: Female  Other Topics Concern  . Not on file  Social History Narrative  . Not on file   Social Determinants of Health   Financial Resource Strain:   . Difficulty of Paying Living Expenses: Not on file  Food Insecurity:   . Worried About Charity fundraiser in the Last Year: Not on file  . Ran Out of Food in the Last Year: Not on file  Transportation Needs:   . Lack of Transportation (Medical): Not on file  . Lack of Transportation (Non-Medical): Not on file  Physical Activity:   . Days of Exercise per Week: Not on file  . Minutes of Exercise per Session: Not on file  Stress:   . Feeling of Stress : Not on file  Social Connections:   . Frequency of Communication with Friends and Family: Not on file  . Frequency of Social Gatherings with Friends and Family: Not on file  . Attends Religious Services: Not on file  . Active Member of Clubs or Organizations: Not on file  . Attends Archivist Meetings: Not on file  . Marital Status: Not on file  Intimate Partner Violence:   . Fear of Current or Ex-Partner: Not on file  . Emotionally Abused: Not on file  . Physically Abused: Not on file  . Sexually Abused: Not on file  :  Review of Systems  Constitutional: Negative.   HENT: Negative.   Eyes: Negative.   Respiratory: Negative.   Cardiovascular: Negative.   Gastrointestinal: Negative.   Genitourinary: Negative.   Musculoskeletal: Negative.   Skin: Negative.   Neurological: Negative.   Endo/Heme/Allergies: Negative.   Psychiatric/Behavioral: Negative.      Exam: This is a  well-developed and well-nourished white male in no obvious distress.  Vital signs show a temperature of 98.2.  Pulse 74.  Blood pressure 163/86.  Weight is 197 pounds.  Head and neck exam shows no ocular or oral lesions.  Conjunctivae are not pale.  He has no conjunctival inflammation.  He has a no palpable thyroid.  He has no adenopathy in the neck or supraclavicular regions.  Lungs are clear bilaterally.  Cardiac exam regular rate and rhythm with no murmurs, rubs or bruits.  Abdomen is soft.  He has good bowel sounds.  There is no fluid wave.  There is no palpable liver or spleen tip.  Back exam shows no tenderness over the spine, ribs or hips.  Extremities shows no clubbing, cyanosis or edema.  He has good range of motion of his joints.  He has good strength in upper and lower extremities.  Skin exam shows no rashes, ecchymoses or petechia.  Neurological exam shows no focal neurological deficits.  @IPVITALS @   Recent Labs    01/28/20 1034  WBC 4.4  HGB 16.5  HCT 48.4  PLT 122*   Recent Labs    01/28/20 1034  NA 138  K 3.6  CL 104  CO2 28  GLUCOSE 141*  BUN 13  CREATININE 1.05  CALCIUM 10.0    Blood smear review: Normochromic and normocytic population of red blood cells.  He has no nucleated red blood cells.  I see no anisocytosis or poikilocytosis.  He has no polychromasia.  There is no rouleaux formation.  He has no target cells.  White blood cells appear normal in morphology maturation.  I see no hypersegmented polys.  He has no immature myeloid or lymphoid cells.  Platelets are adequate in number and size.  He has some platelet clumps.  Pathology:    Assessment and Plan: Mr. Ekholm is a very nice 54 year old white male.  He has iron deficiency.  He is not anemic.  His MCV is not low.  His blood smear is not that remarkable.  I am just not all that concerned about his iron deficiency.  He does not appear to be bothered by this.  He clearly is not anemic.  I suppose the one in  possibility that he might have his polycythemia vera.  With this, you typically see a lower MCV.  Often you will see an elevated platelet count and white cell count.  His platelet count certainly is not elevated.  This would be also consistent with clinically significant iron deficiency.  He does not need a bone marrow biopsy.  I cannot find anything on his physical exam that would suggest an underlying myeloproliferative process.  I am just willing to watch this for right now.  I think that we are safe to be able to watch this.  It would be nice if he could be on some baby aspirin but this probably would irritate his reflux.  I spent about 45 minutes with Mr. Krist.  He is really fun to talk to.  We certainly covered a lot of topics.  I reassured him that I just did not think that we had a hematological issue right here.  I will plan to get him back in 6 months.  If everything looks okay in 6 months, then maybe we can let him go from the clinic.

## 2020-01-29 ENCOUNTER — Encounter: Payer: Self-pay | Admitting: *Deleted

## 2020-01-29 ENCOUNTER — Encounter: Payer: Self-pay | Admitting: Plastic Surgery

## 2020-01-29 LAB — IRON AND TIBC
Iron: 49 ug/dL (ref 42–163)
Saturation Ratios: 20 % (ref 20–55)
TIBC: 243 ug/dL (ref 202–409)
UIBC: 193 ug/dL (ref 117–376)

## 2020-01-29 LAB — FERRITIN: Ferritin: 152 ng/mL (ref 24–336)

## 2020-01-29 LAB — ERYTHROPOIETIN: Erythropoietin: 11.4 m[IU]/mL (ref 2.6–18.5)

## 2020-01-31 ENCOUNTER — Other Ambulatory Visit (HOSPITAL_COMMUNITY)
Admission: RE | Admit: 2020-01-31 | Discharge: 2020-01-31 | Disposition: A | Discharge status: Home / Self Care | Payer: BC Managed Care – PPO | Source: Ambulatory Visit | Attending: Gastroenterology | Admitting: Gastroenterology

## 2020-01-31 DIAGNOSIS — Z20822 Contact with and (suspected) exposure to covid-19: Secondary | ICD-10-CM | POA: Insufficient documentation

## 2020-01-31 LAB — SARS CORONAVIRUS 2 (TAT 6-24 HRS): SARS Coronavirus 2: NEGATIVE

## 2020-02-03 DIAGNOSIS — R3914 Feeling of incomplete bladder emptying: Secondary | ICD-10-CM | POA: Diagnosis not present

## 2020-02-03 DIAGNOSIS — R102 Pelvic and perineal pain: Secondary | ICD-10-CM | POA: Diagnosis not present

## 2020-02-03 DIAGNOSIS — R300 Dysuria: Secondary | ICD-10-CM | POA: Diagnosis not present

## 2020-02-03 DIAGNOSIS — N401 Enlarged prostate with lower urinary tract symptoms: Secondary | ICD-10-CM | POA: Diagnosis not present

## 2020-02-04 ENCOUNTER — Encounter (HOSPITAL_COMMUNITY): Payer: Self-pay | Admitting: Gastroenterology

## 2020-02-04 ENCOUNTER — Encounter (HOSPITAL_COMMUNITY)
Admission: RE | Disposition: A | Discharge status: Home / Self Care | Payer: Self-pay | Source: Home / Self Care | Attending: Gastroenterology

## 2020-02-04 ENCOUNTER — Ambulatory Visit (HOSPITAL_COMMUNITY)
Admission: RE | Admit: 2020-02-04 | Discharge: 2020-02-04 | Disposition: A | Discharge status: Home / Self Care | Payer: BC Managed Care – PPO | Attending: Gastroenterology | Admitting: Gastroenterology

## 2020-02-04 DIAGNOSIS — K5902 Outlet dysfunction constipation: Secondary | ICD-10-CM

## 2020-02-04 DIAGNOSIS — K5900 Constipation, unspecified: Secondary | ICD-10-CM | POA: Insufficient documentation

## 2020-02-04 HISTORY — PX: ANAL RECTAL MANOMETRY: SHX6358

## 2020-02-04 SURGERY — MANOMETRY, ANORECTAL

## 2020-02-06 ENCOUNTER — Telehealth: Payer: Self-pay

## 2020-02-06 NOTE — Telephone Encounter (Signed)
-----   Message from Greggory Keen, LPN sent at 34/0/3709  4:55 PM EDT ----- Regarding: FW: Hemorrhoid banding  ----- Message ----- From: Mauri Pole, MD Sent: 02/05/2020   1:19 PM EDT To: Greggory Keen, LPN, Burtis Junes, RN Subject: RE: Hemorrhoid banding                         [place], thanks Lattie Haw. Beth, can you please forward to Dr.Beavers's RN to schedule follow up office visit. He has pelvic floor dysfunction, do not recommend hemorrhoid banding until after his bowel habits improve. Thanks VN ----- Message ----- From: Burtis Junes, RN Sent: 02/05/2020   7:05 AM EDT To: Mauri Pole, MD Subject: Hemorrhoid banding                             Patient had anorectal manometry yesterday.  He stated he wanted to have the hemorrhoidal banding done.  I advised him to contact your office.  He asked that I send you a message also.  Burtis Junes, RN

## 2020-02-06 NOTE — Telephone Encounter (Signed)
Pts appt switched to an OV, pt notified via mychart.

## 2020-02-09 ENCOUNTER — Encounter (HOSPITAL_COMMUNITY): Payer: Self-pay | Admitting: Gastroenterology

## 2020-02-19 DIAGNOSIS — N419 Inflammatory disease of prostate, unspecified: Secondary | ICD-10-CM | POA: Diagnosis not present

## 2020-02-19 DIAGNOSIS — K5902 Outlet dysfunction constipation: Secondary | ICD-10-CM

## 2020-02-19 DIAGNOSIS — R102 Pelvic and perineal pain: Secondary | ICD-10-CM | POA: Diagnosis not present

## 2020-02-25 ENCOUNTER — Other Ambulatory Visit: Payer: Self-pay

## 2020-02-25 DIAGNOSIS — K5902 Outlet dysfunction constipation: Secondary | ICD-10-CM

## 2020-02-26 DIAGNOSIS — R102 Pelvic and perineal pain: Secondary | ICD-10-CM | POA: Diagnosis not present

## 2020-02-26 DIAGNOSIS — R300 Dysuria: Secondary | ICD-10-CM | POA: Diagnosis not present

## 2020-02-26 DIAGNOSIS — R3914 Feeling of incomplete bladder emptying: Secondary | ICD-10-CM | POA: Diagnosis not present

## 2020-02-26 DIAGNOSIS — N401 Enlarged prostate with lower urinary tract symptoms: Secondary | ICD-10-CM | POA: Diagnosis not present

## 2020-02-27 DIAGNOSIS — N401 Enlarged prostate with lower urinary tract symptoms: Secondary | ICD-10-CM | POA: Diagnosis not present

## 2020-02-27 DIAGNOSIS — R3914 Feeling of incomplete bladder emptying: Secondary | ICD-10-CM | POA: Diagnosis not present

## 2020-02-27 DIAGNOSIS — M6289 Other specified disorders of muscle: Secondary | ICD-10-CM | POA: Diagnosis not present

## 2020-02-27 DIAGNOSIS — N528 Other male erectile dysfunction: Secondary | ICD-10-CM | POA: Diagnosis not present

## 2020-03-05 DIAGNOSIS — I83891 Varicose veins of right lower extremities with other complications: Secondary | ICD-10-CM | POA: Diagnosis not present

## 2020-03-08 ENCOUNTER — Ambulatory Visit (INDEPENDENT_AMBULATORY_CARE_PROVIDER_SITE_OTHER): Payer: BC Managed Care – PPO | Admitting: Gastroenterology

## 2020-03-08 ENCOUNTER — Encounter: Payer: Self-pay | Admitting: Gastroenterology

## 2020-03-08 VITALS — BP 140/80 | HR 100 | Ht 73.0 in | Wt 198.0 lb

## 2020-03-08 DIAGNOSIS — K602 Anal fissure, unspecified: Secondary | ICD-10-CM

## 2020-03-08 DIAGNOSIS — K5902 Outlet dysfunction constipation: Secondary | ICD-10-CM

## 2020-03-08 DIAGNOSIS — K219 Gastro-esophageal reflux disease without esophagitis: Secondary | ICD-10-CM

## 2020-03-08 DIAGNOSIS — K2270 Barrett's esophagus without dysplasia: Secondary | ICD-10-CM | POA: Diagnosis not present

## 2020-03-08 DIAGNOSIS — D509 Iron deficiency anemia, unspecified: Secondary | ICD-10-CM

## 2020-03-08 MED ORDER — AMBULATORY NON FORMULARY MEDICATION: 1 refills | Status: DC

## 2020-03-08 NOTE — Progress Notes (Signed)
Edward Nelson    [id number]    03-20-1966  Primary Care Physician:Koberlein, Steele Berg, MD  Referring Physician: Caren Macadam, MD Edward Nelson,  [place] [address]   Chief complaint:  Rectal discomfort, dyssynergic defecation   HPI:  54 year old very pleasant gentleman here for follow-up visit.  He is established with Dr. Tarri Glenn, he got scheduled with me instead during scheduling error. He has history of hemorrhoids s/p hemorrhoidectomy. Anorectal manometry suggestive of dyssynergic defecation, he was referred to pelvic floor physical therapy and has done 3 sessions so far with some improvement of his symptoms  He is having severe low back pain, was started on baclofen suppositories recently.  He complains of worsening rectal discomfort and bleeding Is having daily bowel movement daily but has pain on defecation  Planning to undergo imaging for evaluation of low back pain later this week, has follow-up appointment.    Outpatient Encounter Medications as of 03/08/2020  Medication Sig  . baclofen (LIORESAL) 10 MG tablet Take by mouth.  . desonide (DESOWEN) 0.05 % cream Apply to affected areas twice a day until smooth. For face, nipples, genitals.  . fexofenadine (ALLEGRA) 180 MG tablet Take by mouth.  . halobetasol (ULTRAVATE) 0.05 % cream Apply topically.  . Multiple Vitamin (MULTIVITAMIN) capsule Take 1 capsule by mouth daily.  Marland Kitchen TADALAFIL PO Take 6 mg by mouth as needed.  Marland Kitchen omeprazole (PRILOSEC) 40 MG capsule Take 1 capsule (40 mg total) by mouth 2 (two) times daily before a meal. (Patient taking differently: Take 40 mg by mouth daily. )   No facility-administered encounter medications on file as of 03/08/2020.    Allergies as of 03/08/2020 - Review Complete 03/08/2020  Allergen Reaction Noted  . Cayenne Itching 05/03/2013  . Penicillins Other (See Comments) 05/03/2013  . Polyethylene glycol  11/20/2019  . Shellac Other (See  Comments) 07/28/2016  . Tetanus toxoids Other (See Comments) 05/03/2013  . Tetanus toxoid      Past Medical History:  Diagnosis Date  . Enlarged prostate    treated by urologist with [facility]  . GERD (gastroesophageal reflux disease)     Past Surgical History:  Procedure Laterality Date  . ANAL RECTAL MANOMETRY N/A 02/04/2020   Procedure: ANO RECTAL MANOMETRY;  Surgeon: Thornton Park, MD;  Location: [facility];  Service: Gastroenterology;  Laterality: N/A;  . BLADDER SURGERY  2010   Transurethral incision of bladder neck  . [place] SURGERY  2013  . CYSTOSCOPY WITH INSERTION OF UROLIFT    . KNEE ARTHROSCOPY Left   . NASAL SINUS SURGERY Left    started bleeding so didn't complete right side  . right leg fracture  1986   tibial/fib fracture with surgical correction, plate    Family History  Problem Relation Age of Onset  . Breast cancer Mother   . Osteoporosis Mother   . Hypertension Mother   . Arthritis Mother   . Cancer - Other Father        oral  . Breast cancer Sister   . Hypertension Sister   . Other Brother        acid reflux  . Colon cancer Neg Hx   . Rectal cancer Neg Hx   . Stomach cancer Neg Hx   . Esophageal cancer Neg Hx     Social History   Socioeconomic History  . Marital status: Single    Spouse name: Not on file  .  Number of children: 0  . Years of education: Not on file  . Highest education level: Not on file  Occupational History  . Occupation: Fabrication  Tobacco Use  . Smoking status: Former Smoker    Packs/day: 1.00    Years: 35.00    Pack years: 35.00    Types: Cigarettes    Start date: 45    Quit date: 2015    Years since quitting: 6.8  . Smokeless tobacco: Never Used  Vaping Use  . Vaping Use: Never used  Substance and Sexual Activity  . Alcohol use: Yes    Comment: occasionally 1 per day  . Drug use: Never  . Sexual activity: Not Currently    Partners: Female  Other Topics Concern  . Not on file    Social History Narrative  . Not on file   Social Determinants of Health   Financial Resource Strain:   . Difficulty of Paying Living Expenses: Not on file  Food Insecurity:   . Worried About Charity fundraiser in the Last Year: Not on file  . Ran Out of Food in the Last Year: Not on file  Transportation Needs:   . Lack of Transportation (Medical): Not on file  . Lack of Transportation (Non-Medical): Not on file  Physical Activity:   . Days of Exercise per Week: Not on file  . Minutes of Exercise per Session: Not on file  Stress:   . Feeling of Stress : Not on file  Social Connections:   . Frequency of Communication with Friends and Family: Not on file  . Frequency of Social Gatherings with Friends and Family: Not on file  . Attends Religious Services: Not on file  . Active Member of Clubs or Organizations: Not on file  . Attends Archivist Meetings: Not on file  . Marital Status: Not on file  Intimate Partner Violence:   . Fear of Current or Ex-Partner: Not on file  . Emotionally Abused: Not on file  . Physically Abused: Not on file  . Sexually Abused: Not on file      Review of systems: All other review of systems negative except as mentioned in the HPI.   Physical Exam: Vitals:   03/08/20 0938  BP: 140/80  Pulse: 100  SpO2: 97%   Body mass index is 26.12 kg/m. Gen:      No acute distress Neuro: alert and oriented x 3 Psych: normal mood and affect Rectal exam: Increased anal sphincter tone, tenderness+ small external hemorrhoids Anoscopy: Left lateral anal fissure, grade 1 small internal hemorrhoids, no active bleeding, normal dentate line, no visible nodules  Data Reviewed:  Reviewed labs, radiology imaging, old records and pertinent past GI work up   Assessment and Plan/Recommendations: 54 year old very pleasant gentleman with history of chronic GERD, Barrett's esophagus, chronic iron deficiency anemia, low back pain and dyssynergic  defecation  Anal fissure on rectal exam, likely etiology of rectal discomfort and bright red blood per rectum Start nitroglycerin 0.125% small pea-sized amount per rectum 3 times daily for 6 to 8 weeks Advised patient to avoid Viagra, sildenafil or Cialis while using nitroglycerin to prevent adverse drug reaction/cardiovascular event Benefiber 1 tablespoon 3 times daily with meals Avoid excessive pain during defecation  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 Tarri Glenn and Dr. Marin Olp in hematology  GERD and Barrett's esophagus: Managed by Dr. Tarri Glenn  Return for follow-up office visit with Dr. Tarri Glenn in 2 months  This visit required >40 minutes of patient care (this includes precharting, chart review, review of results, face-to-face time used for counseling as well as treatment plan and follow-up. The patient was provided an opportunity to ask questions and all were answered. The patient agreed with the plan and demonstrated an understanding of the instructions.  Damaris Hippo , MD    CC: Caren Macadam, MD

## 2020-03-08 NOTE — Patient Instructions (Signed)
We have sent a prescription for nitroglycerin 0.125% gel to [facility]. You should apply a pea size amount to your rectum three times daily x 6-8 weeks.  [facility]'s information is below: Address: [address], [place], [place] [address]  [phone]  *Please DO NOT go directly from our office to pick up this medication! Give the pharmacy 1 day to process the prescription as this is compounded and takes time to make.  Avoid Viagra/Cialis while taking the Nitroglycerin  Take Benefiber 1 tablespoon three times a day or Fiber Choice with meals  Follow up with Dr Tarri Glenn   If you are age 74 or older, your body mass index should be between 23-30. Your Body mass index is 26.12 kg/m. If this is out of the aforementioned range listed, please consider follow up with your Primary Care Provider.  If you are age 31 or younger, your body mass index should be between 19-25. Your Body mass index is 26.12 kg/m. If this is out of the aformentioned range listed, please consider follow up with your Primary Care Provider.    I appreciate the  opportunity to care for you  Thank You   Kavitha Nandigam,MD

## 2020-03-10 ENCOUNTER — Other Ambulatory Visit: Payer: Self-pay | Admitting: Gastroenterology

## 2020-03-10 DIAGNOSIS — K219 Gastro-esophageal reflux disease without esophagitis: Secondary | ICD-10-CM

## 2020-03-11 ENCOUNTER — Telehealth: Payer: Self-pay | Admitting: *Deleted

## 2020-03-11 DIAGNOSIS — M50322 Other cervical disc degeneration at C5-C6 level: Secondary | ICD-10-CM | POA: Diagnosis not present

## 2020-03-11 DIAGNOSIS — M4322 Fusion of spine, cervical region: Secondary | ICD-10-CM | POA: Diagnosis not present

## 2020-03-11 DIAGNOSIS — M47812 Spondylosis without myelopathy or radiculopathy, cervical region: Secondary | ICD-10-CM | POA: Diagnosis not present

## 2020-03-11 DIAGNOSIS — M4123 Other idiopathic scoliosis, cervicothoracic region: Secondary | ICD-10-CM | POA: Diagnosis not present

## 2020-03-11 NOTE — Telephone Encounter (Signed)
===  View-only below this line=== ----- Message ----- From: Mauri Pole, MD Sent: 03/11/2020   2:38 PM EST To: Oda Kilts, CMA Subject: RE: Anal Fissure                               I told him he cant use cialis before prescribing NTG but he didn't say anything then.  Please send Rx for Diltiazem gel 2% per rectum TID X 6-8 week, small pea size amount per rectum. Thanks ----- Message ----- From: Oda Kilts, CMA Sent: 03/09/2020   3:45 PM EST To: Mauri Pole, MD Subject: FW: Anal Fissure                               Dr Silverio Decamp Please advise , We gave him a script of Nitroglycerin when we seen him in the office  ----- Message ----- From: Marge Duncans Sent: 03/09/2020   1:53 PM EST To: [facility] Subject: Anal Fissure                                   Urologist just got back to me, about the drug interaction with the cream you want ne to get compounded here is the message; would definitely reach out to see if there is any other alternative for the nitroglycerin cream as in my opinion I feel that withholding your Cialis may cause worsening pelvic pressure/straining which would subsequently worsen the anal fissure. So i need an alternative,i am not stopping the cialis,i need to get rid of this pelvic pain,so we need another cream suggestikn or im just going to do nothing and hope it heals on its own.Kermit Balo day, Shanon Brow.Marland Kitchen

## 2020-03-11 NOTE — Telephone Encounter (Signed)
Sent message to patient that we would send him in another medication Diltiazem Gel, I asked him where he needed me to fax it to, Waiting on a response

## 2020-03-12 ENCOUNTER — Encounter: Payer: BC Managed Care – PPO | Admitting: Gastroenterology

## 2020-03-12 ENCOUNTER — Telehealth: Payer: Self-pay | Admitting: Gastroenterology

## 2020-03-12 ENCOUNTER — Encounter: Payer: Self-pay | Admitting: Family Medicine

## 2020-03-12 ENCOUNTER — Other Ambulatory Visit: Payer: Self-pay | Admitting: *Deleted

## 2020-03-12 MED ORDER — DILTIAZEM GEL 2 %: 1.0000 "application " | Freq: Three times a day (TID) | CUTANEOUS | 1 refills | Status: DC

## 2020-03-12 NOTE — Progress Notes (Signed)
See Patient advise request

## 2020-03-12 NOTE — Telephone Encounter (Signed)
Rx was faxed earlier today by Genella Mech, [place]. Confirmation received. Rx does state GEL. Rx has been re-faxed and has been clearly marked to reflect that they are permitted to fill medication in GEL format. Confirmation received. Rx and confirmation will again be retained for our future reference.

## 2020-03-15 DIAGNOSIS — M47812 Spondylosis without myelopathy or radiculopathy, cervical region: Secondary | ICD-10-CM | POA: Diagnosis not present

## 2020-03-15 DIAGNOSIS — M4123 Other idiopathic scoliosis, cervicothoracic region: Secondary | ICD-10-CM | POA: Diagnosis not present

## 2020-03-15 DIAGNOSIS — M50322 Other cervical disc degeneration at C5-C6 level: Secondary | ICD-10-CM | POA: Diagnosis not present

## 2020-03-15 DIAGNOSIS — M4322 Fusion of spine, cervical region: Secondary | ICD-10-CM | POA: Diagnosis not present

## 2020-03-16 DIAGNOSIS — M47812 Spondylosis without myelopathy or radiculopathy, cervical region: Secondary | ICD-10-CM | POA: Diagnosis not present

## 2020-03-16 DIAGNOSIS — M4123 Other idiopathic scoliosis, cervicothoracic region: Secondary | ICD-10-CM | POA: Diagnosis not present

## 2020-03-16 DIAGNOSIS — M4322 Fusion of spine, cervical region: Secondary | ICD-10-CM | POA: Diagnosis not present

## 2020-03-16 DIAGNOSIS — M50322 Other cervical disc degeneration at C5-C6 level: Secondary | ICD-10-CM | POA: Diagnosis not present

## 2020-03-17 ENCOUNTER — Encounter: Payer: Self-pay | Admitting: Family Medicine

## 2020-03-17 DIAGNOSIS — M4123 Other idiopathic scoliosis, cervicothoracic region: Secondary | ICD-10-CM | POA: Diagnosis not present

## 2020-03-17 DIAGNOSIS — M47812 Spondylosis without myelopathy or radiculopathy, cervical region: Secondary | ICD-10-CM | POA: Diagnosis not present

## 2020-03-17 DIAGNOSIS — M50322 Other cervical disc degeneration at C5-C6 level: Secondary | ICD-10-CM | POA: Diagnosis not present

## 2020-03-17 DIAGNOSIS — M4322 Fusion of spine, cervical region: Secondary | ICD-10-CM | POA: Diagnosis not present

## 2020-03-18 DIAGNOSIS — M50322 Other cervical disc degeneration at C5-C6 level: Secondary | ICD-10-CM | POA: Diagnosis not present

## 2020-03-18 DIAGNOSIS — M4322 Fusion of spine, cervical region: Secondary | ICD-10-CM | POA: Diagnosis not present

## 2020-03-18 DIAGNOSIS — M47812 Spondylosis without myelopathy or radiculopathy, cervical region: Secondary | ICD-10-CM | POA: Diagnosis not present

## 2020-03-18 DIAGNOSIS — M4123 Other idiopathic scoliosis, cervicothoracic region: Secondary | ICD-10-CM | POA: Diagnosis not present

## 2020-03-29 DIAGNOSIS — M50322 Other cervical disc degeneration at C5-C6 level: Secondary | ICD-10-CM | POA: Diagnosis not present

## 2020-03-29 DIAGNOSIS — M4123 Other idiopathic scoliosis, cervicothoracic region: Secondary | ICD-10-CM | POA: Diagnosis not present

## 2020-03-29 DIAGNOSIS — M47812 Spondylosis without myelopathy or radiculopathy, cervical region: Secondary | ICD-10-CM | POA: Diagnosis not present

## 2020-03-29 DIAGNOSIS — M4322 Fusion of spine, cervical region: Secondary | ICD-10-CM | POA: Diagnosis not present

## 2020-03-30 ENCOUNTER — Encounter: Payer: Self-pay | Admitting: Plastic Surgery

## 2020-03-30 ENCOUNTER — Other Ambulatory Visit (HOSPITAL_COMMUNITY)
Admission: RE | Admit: 2020-03-30 | Discharge: 2020-03-30 | Disposition: A | Discharge status: Home / Self Care | Payer: BC Managed Care – PPO | Source: Ambulatory Visit | Attending: Plastic Surgery | Admitting: Plastic Surgery

## 2020-03-30 ENCOUNTER — Ambulatory Visit: Payer: BC Managed Care – PPO | Admitting: Plastic Surgery

## 2020-03-30 ENCOUNTER — Other Ambulatory Visit: Payer: Self-pay

## 2020-03-30 VITALS — BP 149/83 | HR 95

## 2020-03-30 DIAGNOSIS — L723 Sebaceous cyst: Secondary | ICD-10-CM | POA: Insufficient documentation

## 2020-03-30 DIAGNOSIS — Q181 Preauricular sinus and cyst: Secondary | ICD-10-CM | POA: Diagnosis not present

## 2020-03-30 NOTE — Progress Notes (Signed)
Procedure Note  Preoperative Dx: Right ear cyst  Postoperative Dx: Same  Procedure: Excision of right ear cyst 1 cm  Anesthesia: Lidocaine 1% with 1:100,000 epinepherine  Indication for Procedure: Cyst  Description of Procedure: Risks and complications were explained to the patient .  Consent was confirmed and the patient understands the risks and benefits.  The potential complications and alternatives were explained and the patient consents.  The patient expressed understanding the option of not having the procedure and the risks of a scar.  Time out was called and all information was confirmed to be correct.    The area was prepped and drapped.  Lidocaine 1% with epinepherine was injected in the subcutaneous area.  After waiting several minutes for the local to take affect a #15 blade was used to incise the skin over the right posterior ear lesion.  Scissors were used to dissect down and around the capsule of the cyst.  The entire cyst was excised.  It appeared normal in nature.  Cystic material was noted.  The skin edges were reapproximated with 6-0 Monocryl vertical mattress sutures used.  A dressing was applied.  The patient was given instructions on how to care for the area and a follow up appointment.  Edward Nelson tolerated the procedure well and there were no complications. The specimen was sent to pathology.  The patient had another lesion of concern on his penis from the previous one.  Due to the location I feel more comfortable with him seeing this with his urologist.

## 2020-03-31 DIAGNOSIS — M4123 Other idiopathic scoliosis, cervicothoracic region: Secondary | ICD-10-CM | POA: Diagnosis not present

## 2020-03-31 DIAGNOSIS — M47812 Spondylosis without myelopathy or radiculopathy, cervical region: Secondary | ICD-10-CM | POA: Diagnosis not present

## 2020-03-31 DIAGNOSIS — M50322 Other cervical disc degeneration at C5-C6 level: Secondary | ICD-10-CM | POA: Diagnosis not present

## 2020-03-31 DIAGNOSIS — M4322 Fusion of spine, cervical region: Secondary | ICD-10-CM | POA: Diagnosis not present

## 2020-04-01 DIAGNOSIS — M4123 Other idiopathic scoliosis, cervicothoracic region: Secondary | ICD-10-CM | POA: Diagnosis not present

## 2020-04-01 DIAGNOSIS — M4322 Fusion of spine, cervical region: Secondary | ICD-10-CM | POA: Diagnosis not present

## 2020-04-01 DIAGNOSIS — M47812 Spondylosis without myelopathy or radiculopathy, cervical region: Secondary | ICD-10-CM | POA: Diagnosis not present

## 2020-04-01 DIAGNOSIS — M50322 Other cervical disc degeneration at C5-C6 level: Secondary | ICD-10-CM | POA: Diagnosis not present

## 2020-04-02 DIAGNOSIS — I83892 Varicose veins of left lower extremities with other complications: Secondary | ICD-10-CM | POA: Diagnosis not present

## 2020-04-02 LAB — SURGICAL PATHOLOGY

## 2020-04-07 DIAGNOSIS — I83891 Varicose veins of right lower extremities with other complications: Secondary | ICD-10-CM | POA: Diagnosis not present

## 2020-04-08 DIAGNOSIS — I83891 Varicose veins of right lower extremities with other complications: Secondary | ICD-10-CM | POA: Diagnosis not present

## 2020-04-09 ENCOUNTER — Encounter: Payer: Self-pay | Admitting: Plastic Surgery

## 2020-04-09 ENCOUNTER — Other Ambulatory Visit: Payer: Self-pay

## 2020-04-09 ENCOUNTER — Ambulatory Visit (INDEPENDENT_AMBULATORY_CARE_PROVIDER_SITE_OTHER): Payer: BC Managed Care – PPO | Admitting: Plastic Surgery

## 2020-04-09 VITALS — BP 136/78 | HR 79 | Temp 98.6°F

## 2020-04-09 DIAGNOSIS — L723 Sebaceous cyst: Secondary | ICD-10-CM

## 2020-04-09 DIAGNOSIS — I83892 Varicose veins of left lower extremities with other complications: Secondary | ICD-10-CM | POA: Diagnosis not present

## 2020-04-09 NOTE — Progress Notes (Signed)
The patient is a 54 year-old male here for follow-up after excision of a lesion on his right posterior earlobe.  The pathology showed a sebaceous cyst.  The area is healing well.  There may be a sign of another cyst.  I told the patient to keep an eye on it and we can excise this if it is the case.  It could also be scar tissue.  I would like to see him back if this gets any larger.  Sutures were removed.  The patient knows to let us know if this gets any bigger.  The patient is set up to see Dr. Claudia Desanctis in urology for his penile lesion.

## 2020-04-11 ENCOUNTER — Encounter: Payer: Self-pay | Admitting: Plastic Surgery

## 2020-04-12 DIAGNOSIS — M50322 Other cervical disc degeneration at C5-C6 level: Secondary | ICD-10-CM | POA: Diagnosis not present

## 2020-04-12 DIAGNOSIS — M4123 Other idiopathic scoliosis, cervicothoracic region: Secondary | ICD-10-CM | POA: Diagnosis not present

## 2020-04-12 DIAGNOSIS — M4322 Fusion of spine, cervical region: Secondary | ICD-10-CM | POA: Diagnosis not present

## 2020-04-12 DIAGNOSIS — M47812 Spondylosis without myelopathy or radiculopathy, cervical region: Secondary | ICD-10-CM | POA: Diagnosis not present

## 2020-04-13 ENCOUNTER — Encounter: Payer: Self-pay | Admitting: Plastic Surgery

## 2020-04-13 MED ORDER — DOXYCYCLINE HYCLATE 100 MG PO TABS: 100.0000 mg | ORAL_TABLET | Freq: Two times a day (BID) | ORAL | 0 refills | Status: AC

## 2020-04-14 DIAGNOSIS — I83891 Varicose veins of right lower extremities with other complications: Secondary | ICD-10-CM | POA: Diagnosis not present

## 2020-04-15 DIAGNOSIS — I83891 Varicose veins of right lower extremities with other complications: Secondary | ICD-10-CM | POA: Diagnosis not present

## 2020-04-19 DIAGNOSIS — I83892 Varicose veins of left lower extremities with other complications: Secondary | ICD-10-CM | POA: Diagnosis not present

## 2020-04-21 ENCOUNTER — Telehealth: Payer: Self-pay | Admitting: *Deleted

## 2020-04-21 NOTE — Telephone Encounter (Signed)
Called and spoke with the patient on (04/13/20) regarding his MyChart message.  Informed the patient that I spoke with Dr. Marla Roe regarding his message and she stated that she would send in a prescription for an antibiotic,and for him to make an appointment to come in to see Johanna,PA-C the next day.    Patient verbalized understanding and agreed.   Prescription sent to the patient's pharmacy.//AB/CMA

## 2020-04-21 NOTE — Telephone Encounter (Addendum)
Called patient on (04/15/20) to see how he was doing.  Patient stated that he has been on the antibiotic for 2 days.  He said he doesn't feel as bad as he did.  Patient stated that he will be out of town, so he would like to schedule and appointment for when he comes back.  Patient was schedule an appointment with Johanna,PA-C for ( Friday-05/07/2020 @ 1:00pm).//AB/CMA

## 2020-04-22 ENCOUNTER — Ambulatory Visit: Payer: BC Managed Care – PPO | Admitting: Gastroenterology

## 2020-05-03 NOTE — Progress Notes (Signed)
Patient is a 55 year old male here for follow-up after undergoing excision of right ear cyst (1 cm) on 03/30/2020 with Dr. Ulice Bold.  Pathology showed a sebaceous cyst.  At last visit on 12/10 the area was healing well but there may be sign of another cyst.  It could possibly be scar tissue.  ~ 5 weeks PO Patient reports after the stitch was removed at his last visit the area swelled up like the cyst returned. Reports since then it has gone down and he's not having any current issues with it.  Denies F/C, N/V, Pain.    Pictures were obtained of the patient and placed in the chart with the patient's or guardian's permission. There is a very small bump present; small cyst vs. scar tissue. There are no signs of infection, redness, drainage, seroma/hematoma. No tenderness to palpation. Recommend keeping an eye on the area. If it increases in size, becomes red,warm to the touch, or painful he should call us and come in. Follow up as needed. Call office with any questions/concerns.

## 2020-05-07 ENCOUNTER — Ambulatory Visit: Payer: BC Managed Care – PPO | Admitting: Plastic Surgery

## 2020-05-07 ENCOUNTER — Encounter: Payer: Self-pay | Admitting: Plastic Surgery

## 2020-05-07 ENCOUNTER — Other Ambulatory Visit: Payer: Self-pay

## 2020-05-07 VITALS — BP 144/82 | HR 78 | Temp 98.3°F

## 2020-05-07 DIAGNOSIS — L723 Sebaceous cyst: Secondary | ICD-10-CM

## 2020-05-10 ENCOUNTER — Ambulatory Visit: Payer: BC Managed Care – PPO | Admitting: Plastic Surgery

## 2020-05-10 ENCOUNTER — Other Ambulatory Visit: Payer: Self-pay | Admitting: Gastroenterology

## 2020-05-10 DIAGNOSIS — K219 Gastro-esophageal reflux disease without esophagitis: Secondary | ICD-10-CM

## 2020-05-12 ENCOUNTER — Encounter: Payer: Self-pay | Admitting: Gastroenterology

## 2020-05-12 ENCOUNTER — Ambulatory Visit: Payer: BC Managed Care – PPO | Admitting: Gastroenterology

## 2020-05-12 VITALS — BP 142/80 | HR 88 | Ht 73.0 in | Wt 204.0 lb

## 2020-05-12 DIAGNOSIS — D509 Iron deficiency anemia, unspecified: Secondary | ICD-10-CM | POA: Diagnosis not present

## 2020-05-12 DIAGNOSIS — K2270 Barrett's esophagus without dysplasia: Secondary | ICD-10-CM

## 2020-05-12 DIAGNOSIS — K602 Anal fissure, unspecified: Secondary | ICD-10-CM | POA: Diagnosis not present

## 2020-05-12 DIAGNOSIS — K219 Gastro-esophageal reflux disease without esophagitis: Secondary | ICD-10-CM

## 2020-05-12 DIAGNOSIS — K5902 Outlet dysfunction constipation: Secondary | ICD-10-CM

## 2020-05-12 MED ORDER — OMEPRAZOLE 40 MG PO CPDR: 40.0000 mg | DELAYED_RELEASE_CAPSULE | Freq: Every day | ORAL | 3 refills | Status: DC

## 2020-05-12 NOTE — Patient Instructions (Addendum)
I did not change your medications today.   Continue to use your fiber supplements daily, drink at least 64 ounces of water, and get regular exercise.  You should have another EGD and colonoscopy in 2024.  I would like to see you at least annually, but, please contact me with any questions or concerns prior to that time.   If you are age 57 or younger, your body mass index should be between 19-25. Your Body mass index is 26.91 kg/m. If this is out of the aformentioned range listed, please consider follow up with your Primary Care Provider.   Thank you for trusting me with your gastrointestinal care!    Thornton Park, MD, MPH

## 2020-05-12 NOTE — Progress Notes (Signed)
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 No additional evaluation planned at this time.   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 Continue fiber supplements. Drink plenty of water.   History of colon polyps: Surveillance recommended in 2024.  Reflux: Adequately controlled on omeprazole.   Barrett's Esophagus: Incidental finding on pathology. Would consider repeat EGD in 2024. He is already on PPI therapy.   PLAN: - Continue FiberChoice (samples provided) - Continue Omeprazole 40 mg daily given his intestinal metaplasia - He will follow-up with pelvic floor PT - EGD 08/2022 years given the diagnosis of very short segment Barrett's - Colonoscopy 08/2022 years given the history of polyps - Follow-up in the office annually, earlier if needed  Please see the "Patient Instructions" section for addition details about the plan.  HPI: Edward Nelson is a 55 y.o. male who returns in scheduled follow-up.  He has history of hemorrhoids s/p hemorrhoidectomy.  Diagnosed with left lateral anal fissure and grade 1 small internal hemorrhoids on anoscopy with  Dr. Silverio Decamp 03/08/2020.  Rectal pain has completely resolved using nitroglycerin 0.125% 3 times daily and fiber supplements.  Anorectal manometry suggestive of dyssynergic defecation, he was referred to pelvic floor physical therapy and has done 3 sessions so far with some improvement of his symptoms. Had to stop due to back pack. Seeing a chiropractor now. Declined referral to sports medicine.  Has appointment at [facility] urology later this week.  He was found to have iron deficiency on routine annual labs last year. No associated symptoms. No overt bleeding.   Endoscopic evaluation 09/19/19 showed:  - Colonoscopy: Hemorrhoids, IC valve polyp, 2 ascending colon polyps, hepatic flexure polyps.  3 of the polyps were adenomas. - EGD: Normal esophagus. Biopsied.Focal intestinal metaplasia consistent with Barrett's Esophagus seen on biopsies. Gastritis and reactive gastropathy. No H pylori. Multiple fundic gland polyps. Erythematous duodenopathy. Biopsied. Biopsies were normal.  - Capsule endoscopy 12/04/2019 showed no small bowel findings.  There was gastritis with erythema and erosions and duodenitis  Labs 11/19/19: iron 39, ferritin 99.2 Labs 01/28/20: iron 49, TIBC 243, ferritin 152    Past Medical History:  Diagnosis Date  . Enlarged prostate    treated by urologist with [facility]  . GERD (gastroesophageal reflux disease)     Past Surgical History:  Procedure Laterality Date  . ANAL RECTAL MANOMETRY N/A 02/04/2020   Procedure: ANO RECTAL MANOMETRY;  Surgeon: Thornton Park, MD;  Location: [facility];  Service: Gastroenterology;  Laterality: N/A;  . BLADDER SURGERY  2010   Transurethral incision of bladder neck  . [place] SURGERY  2013  . CYSTOSCOPY WITH INSERTION OF UROLIFT    . KNEE ARTHROSCOPY Left   . NASAL SINUS SURGERY Left  started bleeding so didn't complete right side  . right leg fracture  1986   tibial/fib fracture with surgical correction, plate    Current  Outpatient Medications  Medication Sig Dispense Refill  . baclofen (LIORESAL) 10 MG tablet Take by mouth.    . desonide (DESOWEN) 0.05 % cream Apply to affected areas twice a day until smooth. For face, nipples, genitals.    . fexofenadine (ALLEGRA) 180 MG tablet Take by mouth.    . halobetasol (ULTRAVATE) 0.05 % cream Apply topically.    . Multiple Vitamin (MULTIVITAMIN) capsule Take 1 capsule by mouth daily.    Marland Kitchen omeprazole (PRILOSEC) 40 MG capsule TAKE 1 CAPSULE BY MOUTH TWICE DAILY BEFORE A MEAL 180 capsule 3  . TADALAFIL PO Take 6 mg by mouth as needed.     No current facility-administered medications for this visit.    Allergies as of 05/12/2020 - Review Complete 05/12/2020  Allergen Reaction Noted  . Cayenne Itching 05/03/2013  . Penicillins Other (See Comments) 05/03/2013  . Polyethylene glycol  11/20/2019  . Shellac Other (See Comments) 07/28/2016  . Tetanus toxoids Other (See Comments) 05/03/2013  . Tetanus toxoid      Family History  Problem Relation Age of Onset  . Breast cancer Mother   . Osteoporosis Mother   . Hypertension Mother   . Arthritis Mother   . Cancer - Other Father        oral  . Breast cancer Sister   . Hypertension Sister   . Other Brother        acid reflux  . Colon cancer Neg Hx   . Rectal cancer Neg Hx   . Stomach cancer Neg Hx   . Esophageal cancer Neg Hx     Social History   Socioeconomic History  . Marital status: Single    Spouse name: Not on file  . Number of children: 0  . Years of education: Not on file  . Highest education level: Not on file  Occupational History  . Occupation: Fabrication  Tobacco Use  . Smoking status: Former Smoker    Packs/day: 1.00    Years: 35.00    Pack years: 35.00    Types: Cigarettes    Start date: 36    Quit date: 2015    Years since quitting: 7.0  . Smokeless tobacco: Never Used  Vaping Use  . Vaping Use: Never used  Substance and Sexual Activity  . Alcohol use: Yes    Comment:  occasionally 1 per day  . Drug use: Never  . Sexual activity: Not Currently    Partners: Female  Other Topics Concern  . Not on file  Social History Narrative  . Not on file   Social Determinants of Health   Financial Resource Strain: Not on file  Food Insecurity: Not on file  Transportation Needs: Not on file  Physical Activity: Not on file  Stress: Not on file  Social Connections: Not on file  Intimate Partner Violence: Not on file   Physical Exam: General:   Alert,  well-nourished, pleasant and cooperative in NAD Head:  Normocephalic and atraumatic. Eyes:  Sclera clear, no icterus.   Conjunctiva pink. Abdomen:  Soft, nontender, nondistended, normal bowel sounds, no rebound or guarding. No hepatosplenomegaly.   Rectal:  No chemical dermatitis. Small external hemorrhoids. Fissure has resolved. No prolapsing hemorrhoids. No rectal prolapse. Normal anocutaneous reflex. No stool in the rectal vault. No mass or fecal impaction. Normal anal resting tone.  Neurologic:  Alert and  oriented x4;  grossly nonfocal Skin:  Intact without significant lesions or rashes. Nails appear normal.  Psych:  Alert and cooperative. Normal mood and affect.    Shervon Kerwin L. Tarri Glenn, MD, MPH 05/12/2020, 9:22 AM

## 2020-05-13 DIAGNOSIS — I83892 Varicose veins of left lower extremities with other complications: Secondary | ICD-10-CM | POA: Diagnosis not present

## 2020-05-20 DIAGNOSIS — I83891 Varicose veins of right lower extremities with other complications: Secondary | ICD-10-CM | POA: Diagnosis not present

## 2020-05-21 DIAGNOSIS — M9905 Segmental and somatic dysfunction of pelvic region: Secondary | ICD-10-CM | POA: Diagnosis not present

## 2020-05-21 DIAGNOSIS — N4829 Other inflammatory disorders of penis: Secondary | ICD-10-CM | POA: Diagnosis not present

## 2020-05-27 DIAGNOSIS — I83891 Varicose veins of right lower extremities with other complications: Secondary | ICD-10-CM | POA: Diagnosis not present

## 2020-05-28 NOTE — Telephone Encounter (Signed)
Date Time Type Summary User  11/27/2019 12:44 PM EDT Insurance Verification Per [employer] automated service, npr HAZELWOOD, AMY L  Note Text:  Per [employer] automated service, npr Ref# [id number]   This is the information I have from that time.  There was no authorization required from his insurance.  We can not guarantee that insurance will cover any procedure. They review the information and make determination when the claim is received.

## 2020-05-31 NOTE — Telephone Encounter (Signed)
Good morning.  I called for pre-cert information for this capsule and this is what I received.  There was no precert required.  That is why we get reference numbers to show that we called about it.  Per [employer], npr Ref# [id number] Dr. Tarri Glenn, if you would go ahead and dictate a letter, then I will go ahead and send it along with all of the records needed.  After I hear back, I will let everyone know. Thanks, Amy

## 2020-06-08 DIAGNOSIS — M62838 Other muscle spasm: Secondary | ICD-10-CM | POA: Diagnosis not present

## 2020-06-08 DIAGNOSIS — M6289 Other specified disorders of muscle: Secondary | ICD-10-CM | POA: Diagnosis not present

## 2020-06-08 DIAGNOSIS — R102 Pelvic and perineal pain: Secondary | ICD-10-CM | POA: Diagnosis not present

## 2020-06-08 DIAGNOSIS — M6281 Muscle weakness (generalized): Secondary | ICD-10-CM | POA: Diagnosis not present

## 2020-06-10 DIAGNOSIS — I83892 Varicose veins of left lower extremities with other complications: Secondary | ICD-10-CM | POA: Diagnosis not present

## 2020-06-14 DIAGNOSIS — M47812 Spondylosis without myelopathy or radiculopathy, cervical region: Secondary | ICD-10-CM | POA: Diagnosis not present

## 2020-06-14 DIAGNOSIS — M4322 Fusion of spine, cervical region: Secondary | ICD-10-CM | POA: Diagnosis not present

## 2020-06-14 DIAGNOSIS — M4123 Other idiopathic scoliosis, cervicothoracic region: Secondary | ICD-10-CM | POA: Diagnosis not present

## 2020-06-14 DIAGNOSIS — M50322 Other cervical disc degeneration at C5-C6 level: Secondary | ICD-10-CM | POA: Diagnosis not present

## 2020-06-15 DIAGNOSIS — I83891 Varicose veins of right lower extremities with other complications: Secondary | ICD-10-CM | POA: Diagnosis not present

## 2020-06-15 DIAGNOSIS — R102 Pelvic and perineal pain: Secondary | ICD-10-CM | POA: Diagnosis not present

## 2020-06-15 DIAGNOSIS — M62838 Other muscle spasm: Secondary | ICD-10-CM | POA: Diagnosis not present

## 2020-06-15 DIAGNOSIS — M6289 Other specified disorders of muscle: Secondary | ICD-10-CM | POA: Diagnosis not present

## 2020-06-15 DIAGNOSIS — M6281 Muscle weakness (generalized): Secondary | ICD-10-CM | POA: Diagnosis not present

## 2020-06-16 DIAGNOSIS — M4322 Fusion of spine, cervical region: Secondary | ICD-10-CM | POA: Diagnosis not present

## 2020-06-16 DIAGNOSIS — M50322 Other cervical disc degeneration at C5-C6 level: Secondary | ICD-10-CM | POA: Diagnosis not present

## 2020-06-16 DIAGNOSIS — M47812 Spondylosis without myelopathy or radiculopathy, cervical region: Secondary | ICD-10-CM | POA: Diagnosis not present

## 2020-06-16 DIAGNOSIS — M4123 Other idiopathic scoliosis, cervicothoracic region: Secondary | ICD-10-CM | POA: Diagnosis not present

## 2020-06-23 DIAGNOSIS — M62838 Other muscle spasm: Secondary | ICD-10-CM | POA: Diagnosis not present

## 2020-06-23 DIAGNOSIS — R102 Pelvic and perineal pain: Secondary | ICD-10-CM | POA: Diagnosis not present

## 2020-06-23 DIAGNOSIS — M6281 Muscle weakness (generalized): Secondary | ICD-10-CM | POA: Diagnosis not present

## 2020-06-24 ENCOUNTER — Other Ambulatory Visit: Payer: Self-pay

## 2020-06-24 DIAGNOSIS — I83891 Varicose veins of right lower extremities with other complications: Secondary | ICD-10-CM | POA: Diagnosis not present

## 2020-06-25 ENCOUNTER — Other Ambulatory Visit: Payer: Self-pay | Admitting: Family Medicine

## 2020-06-25 ENCOUNTER — Encounter: Payer: Self-pay | Admitting: Family Medicine

## 2020-06-25 ENCOUNTER — Ambulatory Visit (INDEPENDENT_AMBULATORY_CARE_PROVIDER_SITE_OTHER): Payer: BC Managed Care – PPO | Admitting: Family Medicine

## 2020-06-25 ENCOUNTER — Ambulatory Visit (INDEPENDENT_AMBULATORY_CARE_PROVIDER_SITE_OTHER): Payer: BC Managed Care – PPO

## 2020-06-25 ENCOUNTER — Ambulatory Visit: Payer: BC Managed Care – PPO

## 2020-06-25 VITALS — BP 120/68 | HR 89 | Temp 98.0°F | Ht 72.75 in | Wt 200.5 lb

## 2020-06-25 DIAGNOSIS — Z13220 Encounter for screening for lipoid disorders: Secondary | ICD-10-CM

## 2020-06-25 DIAGNOSIS — M7989 Other specified soft tissue disorders: Secondary | ICD-10-CM | POA: Diagnosis not present

## 2020-06-25 DIAGNOSIS — M79672 Pain in left foot: Secondary | ICD-10-CM

## 2020-06-25 DIAGNOSIS — Z0000 Encounter for general adult medical examination without abnormal findings: Secondary | ICD-10-CM

## 2020-06-25 DIAGNOSIS — R600 Localized edema: Secondary | ICD-10-CM | POA: Diagnosis not present

## 2020-06-25 DIAGNOSIS — E611 Iron deficiency: Secondary | ICD-10-CM

## 2020-06-25 DIAGNOSIS — M19072 Primary osteoarthritis, left ankle and foot: Secondary | ICD-10-CM | POA: Diagnosis not present

## 2020-06-25 DIAGNOSIS — K219 Gastro-esophageal reflux disease without esophagitis: Secondary | ICD-10-CM | POA: Diagnosis not present

## 2020-06-25 DIAGNOSIS — Z23 Encounter for immunization: Secondary | ICD-10-CM | POA: Diagnosis not present

## 2020-06-25 LAB — SEDIMENTATION RATE: Sed Rate: 3 mm/hr (ref 0–20)

## 2020-06-25 LAB — LIPID PANEL
Cholesterol: 149 mg/dL (ref 0–200)
HDL: 40 mg/dL (ref >39.00)
LDL Cholesterol: 100 mg/dL — ABNORMAL HIGH (ref 0–99)
NonHDL: 108.71
Total CHOL/HDL Ratio: 4
Triglycerides: 44 mg/dL (ref 0.0–149.0)
VLDL: 8.8 mg/dL (ref 0.0–40.0)

## 2020-06-25 LAB — URIC ACID: Uric Acid, Serum: 4.5 mg/dL (ref 4.0–7.8)

## 2020-06-25 LAB — COMPREHENSIVE METABOLIC PANEL
ALT: 17 U/L (ref 0–53)
AST: 15 U/L (ref 0–37)
Albumin: 4.1 g/dL (ref 3.5–5.2)
Alkaline Phosphatase: 87 U/L (ref 39–117)
BUN: 12 mg/dL (ref 6–23)
CO2: 28 mEq/L (ref 19–32)
Calcium: 9.1 mg/dL (ref 8.4–10.5)
Chloride: 104 mEq/L (ref 96–112)
Creatinine, Ser: 0.98 mg/dL (ref 0.40–1.50)
GFR: 86.3 mL/min (ref >60.00)
Glucose, Bld: 106 mg/dL — ABNORMAL HIGH (ref 70–99)
Potassium: 3.7 mEq/L (ref 3.5–5.1)
Sodium: 138 mEq/L (ref 135–145)
Total Bilirubin: 0.7 mg/dL (ref 0.2–1.2)
Total Protein: 6.2 g/dL (ref 6.0–8.3)

## 2020-06-25 LAB — CBC WITH DIFFERENTIAL/PLATELET
Basophils Absolute: 0 10*3/uL (ref 0.0–0.1)
Basophils Relative: 0.5 % (ref 0.0–3.0)
Eosinophils Absolute: 0.1 10*3/uL (ref 0.0–0.7)
Eosinophils Relative: 2.1 % (ref 0.0–5.0)
HCT: 42.3 % (ref 39.0–52.0)
Hemoglobin: 14.6 g/dL (ref 13.0–17.0)
Lymphocytes Relative: 22.1 % (ref 12.0–46.0)
Lymphs Abs: 1.3 10*3/uL (ref 0.7–4.0)
MCHC: 34.5 g/dL (ref 30.0–36.0)
MCV: 88.8 fl (ref 78.0–100.0)
Monocytes Absolute: 0.5 10*3/uL (ref 0.1–1.0)
Monocytes Relative: 8.7 % (ref 3.0–12.0)
Neutro Abs: 3.9 10*3/uL (ref 1.4–7.7)
Neutrophils Relative %: 66.6 % (ref 43.0–77.0)
Platelets: 155 10*3/uL (ref 150.0–400.0)
RBC: 4.76 Mil/uL (ref 4.22–5.81)
RDW: 13 % (ref 11.5–15.5)
WBC: 5.8 10*3/uL (ref 4.0–10.5)

## 2020-06-25 MED ORDER — TRIAMCINOLONE ACETONIDE 0.1 % EX CREA: 1.0000 "application " | TOPICAL_CREAM | Freq: Two times a day (BID) | CUTANEOUS | 0 refills | Status: DC

## 2020-06-25 NOTE — Progress Notes (Signed)
Edward Nelson DOB: [birth year] Encounter date: 06/25/2020  This is a 55 y.o. male who presents for complete physical   History of present illness/Additional concerns: Left foot keeps swelling up and he isn't sure why. Starting to limp. Whole top of foot hurts. Going on for a couple of months now. Not sure if work shoes. No redness, no warmth. No injury he knows of; no history of injury. Vascular doc didn't think it was related to vascular treatments. Had last injections in left leg yesterday. Hasn't had charlie horses since getting these treated.   Has been seeing chiropractor for neck and back; suggested tendonitis may be the issue with foot, but hasn't had any formal evaluation.   Had urolift procedure done - seemed to do well after this was done, but started to have problems again with urinating and with bowel movements. Working on exercises with pelvic floor therapy to help with pain in groin, penis. Has had three sessions thus far. Still having issues with bowel/bladder. Mostly issue when he wakes up. Bowel issues have improved some. Issue in morning with bladder is starting stream.   Sees Dr. Delman Cheadle for dermatology. Was sent to plastic surgery to get cyst behind ear removed.   Had cyst on penis that went away when on oral antibiotics; sebaceous. Saw specialist in urology but they didn't want to remove cyst.  Middle of back is itching; has been there a couple of weeks. No changes in soap, detergent. Worse when getting out of shower.   He failed hearing eval at work - hearing is not bothering him. It had been a change from his baseline. He is good about wearing hearing protection at work.   Acid reflux - doing ok with the 40mg  of omeprazole. Tries to avoid triggering foods/drinks.  Taking antihistamine daily along with vitamin. Allergies always there   Past Medical History:  Diagnosis Date  . Enlarged prostate    treated by urologist with [facility]  . GERD (gastroesophageal  reflux disease)    Past Surgical History:  Procedure Laterality Date  . ANAL RECTAL MANOMETRY N/A 02/04/2020   Procedure: ANO RECTAL MANOMETRY;  Surgeon: Thornton Park, MD;  Location: [facility];  Service: Gastroenterology;  Laterality: N/A;  . BLADDER SURGERY  2010   Transurethral incision of bladder neck  . [place] SURGERY  2013  . CYSTOSCOPY WITH INSERTION OF UROLIFT    . KNEE ARTHROSCOPY Left   . NASAL SINUS SURGERY Left    started bleeding so didn't complete right side  . right leg fracture  1986   tibial/fib fracture with surgical correction, plate   Allergies  Allergen Reactions  . Cayenne Itching  . Penicillins Other (See Comments)    Not known Not known   . Polyethylene Glycol   . Shellac Other (See Comments)    Positive patch test Positive patch test Positive patch test Positive patch test   . Tetanus Toxoids Other (See Comments)    Not known Not known   . Tetanus Toxoid    Current Meds  Medication Sig  . desonide (DESOWEN) 0.05 % cream Apply to affected areas twice a day until smooth. For face, nipples, genitals.  . fexofenadine (ALLEGRA) 180 MG tablet Take by mouth.  . Multiple Vitamin (MULTIVITAMIN) capsule Take 1 capsule by mouth daily.  Marland Kitchen omeprazole (PRILOSEC) 40 MG capsule Take 1 capsule (40 mg total) by mouth daily.  Marland Kitchen TADALAFIL PO Take 5 mg by mouth as needed.  . triamcinolone (KENALOG) 0.1 %  Apply 1 application topically 2 (two) times daily. Apply to back x 14 days   Social History   Tobacco Use  . Smoking status: Former Smoker    Packs/day: 1.00    Years: 35.00    Pack years: 35.00    Types: Cigarettes    Start date: 31    Quit date: 2015    Years since quitting: 7.1  . Smokeless tobacco: Never Used  Substance Use Topics  . Alcohol use: Yes    Comment: occasionally 1 per day   Family History  Problem Relation Age of Onset  . Breast cancer Mother   . Osteoporosis Mother   . Hypertension Mother   . Arthritis Mother   .  Cancer - Other Father        oral  . Breast cancer Sister   . Hypertension Sister   . Other Brother        acid reflux  . Colon cancer Neg Hx   . Rectal cancer Neg Hx   . Stomach cancer Neg Hx   . Esophageal cancer Neg Hx      Review of Systems  Constitutional: Negative for activity change, appetite change, chills, fatigue, fever and unexpected weight change.  HENT: Negative for congestion, ear pain, hearing loss, sinus pressure, sinus pain, sore throat and trouble swallowing.   Eyes: Negative for pain and visual disturbance.  Respiratory: Negative for cough, chest tightness, shortness of breath and wheezing.   Cardiovascular: Positive for leg swelling. Negative for chest pain and palpitations.  Gastrointestinal: Negative for abdominal distention, abdominal pain, blood in stool, constipation, diarrhea, nausea and vomiting.  Genitourinary: Negative for decreased urine volume, difficulty urinating, dysuria, penile pain and testicular pain.  Musculoskeletal: Positive for gait problem (due to foot pain). Negative for arthralgias, back pain and joint swelling.  Skin: Negative for rash.  Neurological: Negative for dizziness, weakness, numbness and headaches.  Hematological: Negative for adenopathy. Does not bruise/bleed easily.  Psychiatric/Behavioral: Negative for agitation, sleep disturbance and suicidal ideas. The patient is not nervous/anxious.     CBC:  Lab Results  Component Value Date   WBC 5.8 06/25/2020   HGB 14.6 06/25/2020   HGB 16.5 01/28/2020   HCT 42.3 06/25/2020   MCH 30.3 01/28/2020   MCHC 34.5 06/25/2020   RDW 13.0 06/25/2020   PLT 155.0 06/25/2020   PLT 122 (L) 01/28/2020   CMP: Lab Results  Component Value Date   NA 138 06/25/2020   K 3.7 06/25/2020   CL 104 06/25/2020   CO2 28 06/25/2020   ANIONGAP 6 01/28/2020   GLUCOSE 106 (H) 06/25/2020   BUN 12 06/25/2020   CREATININE 0.98 06/25/2020   CREATININE 1.05 01/28/2020   GFRAA >60 01/28/2020   CALCIUM  9.1 06/25/2020   PROT 6.2 06/25/2020   BILITOT 0.7 06/25/2020   BILITOT 0.5 01/28/2020   ALKPHOS 87 06/25/2020   ALT 17 06/25/2020   ALT 21 01/28/2020   AST 15 06/25/2020   AST 18 01/28/2020   LIPID: Lab Results  Component Value Date   CHOL 149 06/25/2020   TRIG 44.0 06/25/2020   HDL 40.00 06/25/2020   LDLCALC 100 (H) 06/25/2020    Objective:  BP 120/68 (BP Location: Left Arm, Patient Position: Sitting, Cuff Size: Normal)   Pulse 89   Temp 98 F (36.7 C) (Oral)   Ht 6' 0.75" (1.848 m)   Wt 200 lb 8 oz (90.9 kg)   SpO2 98%   BMI 26.63 kg/m  Weight: 200 lb 8 oz (90.9 kg)   BP Readings from Last 3 Encounters:  06/25/20 120/68  05/12/20 (!) 142/80  05/07/20 (!) 144/82   Wt Readings from Last 3 Encounters:  06/25/20 200 lb 8 oz (90.9 kg)  05/12/20 204 lb (92.5 kg)  03/08/20 198 lb (89.8 kg)    Physical Exam Constitutional:      General: He is not in acute distress.    Appearance: He is well-developed and well-nourished.  HENT:     Head: Normocephalic and atraumatic.     Right Ear: External ear normal.     Left Ear: External ear normal.     Nose: Nose normal.     Mouth/Throat:     Mouth: Oropharynx is clear and moist.     Pharynx: No oropharyngeal exudate.  Eyes:     Conjunctiva/sclera: Conjunctivae normal.     Pupils: Pupils are equal, round, and reactive to light.  Neck:     Thyroid: No thyromegaly.  Cardiovascular:     Rate and Rhythm: Normal rate and regular rhythm.     Pulses: Intact distal pulses.     Heart sounds: Normal heart sounds. No murmur heard. No friction rub. No gallop.      Comments: There is edema of whole left foot; mild erythema top of foot, there is tenderness to palpation of tarsals left foot - most significant 3/4th.  Pulmonary:     Effort: Pulmonary effort is normal. No respiratory distress.     Breath sounds: Normal breath sounds. No stridor. No wheezing or rales.  Abdominal:     General: Bowel sounds are normal.      Palpations: Abdomen is soft.  Musculoskeletal:        General: Normal range of motion.     Cervical back: Neck supple.     Right lower leg: No edema.  Skin:    General: Skin is warm and dry.  Neurological:     Mental Status: He is alert and oriented to person, place, and time.  Psychiatric:        Mood and Affect: Mood and affect normal.        Behavior: Behavior normal.        Thought Content: Thought content normal.        Judgment: Judgment normal.     Assessment/Plan: There are no preventive care reminders to display for this patient. Health Maintenance reviewed - shingrix, flu shot given today.  1. Preventative health care Discussed regular exercise and healthy eating. He is working on therapy exercises as well outside of therapy sessions.   2. Gastroesophageal reflux disease, unspecified whether esophagitis present Continue with omeprazole 40mg  daily.  3. Left foot pain Uncertain etiology of swelling. Mild redness; so consider abx treatment pending xray and bloodwork. Also discussed possibility of gout. - Uric acid; Future - DG Foot Complete Left; Future - CBC with Differential/Platelet; Future - Sedimentation rate; Future - Sedimentation rate - CBC with Differential/Platelet - Uric acid  4. Swelling of left foot See above - Comprehensive metabolic panel; Future - Sedimentation rate; Future - Sedimentation rate - Comprehensive metabolic panel  5. Iron deficiency Will recheck bloodwork today.  6. Lipid screening - Lipid panel; Future - Lipid panel  7. Need for immunization against influenza - Flu Vaccine QUAD 6+ mos PF IM (Fluarix Quad PF)  8. Need for shingles vaccine - Varicella-zoster vaccine IM  Return for pending bloodwork.  Micheline Rough, MD

## 2020-06-28 ENCOUNTER — Encounter: Payer: Self-pay | Admitting: Family Medicine

## 2020-06-30 ENCOUNTER — Encounter: Payer: Self-pay | Admitting: Family Medicine

## 2020-06-30 DIAGNOSIS — M79672 Pain in left foot: Secondary | ICD-10-CM

## 2020-07-01 ENCOUNTER — Other Ambulatory Visit: Payer: Self-pay | Admitting: Family Medicine

## 2020-07-01 MED ORDER — PREDNISONE 20 MG PO TABS: ORAL_TABLET | ORAL | 0 refills | Status: DC

## 2020-07-09 DIAGNOSIS — M6289 Other specified disorders of muscle: Secondary | ICD-10-CM | POA: Diagnosis not present

## 2020-07-09 DIAGNOSIS — M62838 Other muscle spasm: Secondary | ICD-10-CM | POA: Diagnosis not present

## 2020-07-09 DIAGNOSIS — M6281 Muscle weakness (generalized): Secondary | ICD-10-CM | POA: Diagnosis not present

## 2020-07-09 DIAGNOSIS — R102 Pelvic and perineal pain: Secondary | ICD-10-CM | POA: Diagnosis not present

## 2020-07-14 NOTE — Telephone Encounter (Signed)
Edward Nelson, see last note.  I had documentation from insurance company with reference number that precert was not required, but I would be happy to send in the clinicals with a dictated letter from Dr. Tarri Glenn.  Currently, there isn't a dictated letter completed.

## 2020-07-15 NOTE — Telephone Encounter (Signed)
Hey Dr. Tarri Glenn, I would say dictate the letter to [facility] and [facility] first addressing that we called for auth information on 11/27/19.  I received info that the code did not require an authorization.  The reference number is [id number].  They should be able to look that up and see that we called. I would also say to give the info from the patient's H&P.  I can send any EGD's and Colonoscopies that were done to support the guidelines that may be required along with that letter. Thanks, Amy

## 2020-07-19 DIAGNOSIS — R102 Pelvic and perineal pain: Secondary | ICD-10-CM | POA: Diagnosis not present

## 2020-07-19 DIAGNOSIS — R3916 Straining to void: Secondary | ICD-10-CM | POA: Diagnosis not present

## 2020-07-19 DIAGNOSIS — N401 Enlarged prostate with lower urinary tract symptoms: Secondary | ICD-10-CM | POA: Diagnosis not present

## 2020-07-19 DIAGNOSIS — N4829 Other inflammatory disorders of penis: Secondary | ICD-10-CM | POA: Diagnosis not present

## 2020-07-20 ENCOUNTER — Encounter: Payer: Self-pay | Admitting: Gastroenterology

## 2020-07-20 NOTE — Progress Notes (Signed)
     July 20, 2020  Dear [name] and [facility] representative:  Edward Nelson (date of birth [birth year]) has been under evaluation for unexplained iron deficiency with a serum iron of 24 06/23/19.  EGD and colonoscopy 09/19/19 did not reveal a source. Three small tubular adenomas were removed. He was diagnosed with short segment Barrett's Esophagus, mild gastritis, duodenopathy, and fundic gland polyps. However, none of these findings explained his iron deficiency anemia. Capsule endoscopy was recommended for further evaluation 11/19/19 when follow-up labs 11/18/20 showed persistently low serum iron levels at 39 despite adherence with oral iron supplements.   My office contacted [name] and [facility] for authorization 11/27/19 (reference number [id number]). The information that we received from [facility] showed that a precertification authorization was not needed for the code provided.  Please let me know what additional information is needed for process this claim.   Thank you,  Thornton Park, MD, MPH

## 2020-07-22 DIAGNOSIS — I83891 Varicose veins of right lower extremities with other complications: Secondary | ICD-10-CM | POA: Diagnosis not present

## 2020-07-27 ENCOUNTER — Encounter: Payer: Self-pay | Admitting: Hematology & Oncology

## 2020-07-27 ENCOUNTER — Inpatient Hospital Stay: Payer: BC Managed Care – PPO | Admitting: Hematology & Oncology

## 2020-07-27 ENCOUNTER — Inpatient Hospital Stay: Payer: BC Managed Care – PPO | Attending: Hematology & Oncology

## 2020-07-27 ENCOUNTER — Other Ambulatory Visit: Payer: Self-pay

## 2020-07-27 VITALS — BP 140/84 | HR 88 | Temp 98.4°F | Resp 20 | Wt 201.4 lb

## 2020-07-27 DIAGNOSIS — D500 Iron deficiency anemia secondary to blood loss (chronic): Secondary | ICD-10-CM

## 2020-07-27 DIAGNOSIS — K2270 Barrett's esophagus without dysplasia: Secondary | ICD-10-CM | POA: Diagnosis not present

## 2020-07-27 DIAGNOSIS — D509 Iron deficiency anemia, unspecified: Secondary | ICD-10-CM | POA: Insufficient documentation

## 2020-07-27 DIAGNOSIS — K640 First degree hemorrhoids: Secondary | ICD-10-CM | POA: Diagnosis not present

## 2020-07-27 DIAGNOSIS — Z79899 Other long term (current) drug therapy: Secondary | ICD-10-CM | POA: Insufficient documentation

## 2020-07-27 LAB — SAVE SMEAR(SSMR), FOR PROVIDER SLIDE REVIEW

## 2020-07-27 LAB — VITAMIN B12: Vitamin B-12: 409 pg/mL (ref 180–914)

## 2020-07-27 LAB — CBC WITH DIFFERENTIAL (CANCER CENTER ONLY)
Abs Immature Granulocytes: 0.03 10*3/uL (ref 0.00–0.07)
Basophils Absolute: 0 10*3/uL (ref 0.0–0.1)
Basophils Relative: 0 %
Eosinophils Absolute: 0.2 10*3/uL (ref 0.0–0.5)
Eosinophils Relative: 4 %
HCT: 41.9 % (ref 39.0–52.0)
Hemoglobin: 14.4 g/dL (ref 13.0–17.0)
Immature Granulocytes: 1 %
Lymphocytes Relative: 43 %
Lymphs Abs: 2 10*3/uL (ref 0.7–4.0)
MCH: 30.4 pg (ref 26.0–34.0)
MCHC: 34.4 g/dL (ref 30.0–36.0)
MCV: 88.4 fL (ref 80.0–100.0)
Monocytes Absolute: 0.4 10*3/uL (ref 0.1–1.0)
Monocytes Relative: 9 %
Neutro Abs: 2 10*3/uL (ref 1.7–7.7)
Neutrophils Relative %: 43 %
Platelet Count: 143 10*3/uL — ABNORMAL LOW (ref 150–400)
RBC: 4.74 MIL/uL (ref 4.22–5.81)
RDW: 12.6 % (ref 11.5–15.5)
WBC Count: 4.6 10*3/uL (ref 4.0–10.5)
nRBC: 0 % (ref 0.0–0.2)

## 2020-07-27 LAB — CMP (CANCER CENTER ONLY)
ALT: 16 U/L (ref 0–44)
AST: 14 U/L — ABNORMAL LOW (ref 15–41)
Albumin: 3.9 g/dL (ref 3.5–5.0)
Alkaline Phosphatase: 84 U/L (ref 38–126)
Anion gap: 7 (ref 5–15)
BUN: 13 mg/dL (ref 6–20)
CO2: 27 mmol/L (ref 22–32)
Calcium: 9.1 mg/dL (ref 8.9–10.3)
Chloride: 104 mmol/L (ref 98–111)
Creatinine: 1.04 mg/dL (ref 0.61–1.24)
GFR, Estimated: >60 mL/min (ref >60)
Glucose, Bld: 142 mg/dL — ABNORMAL HIGH (ref 70–99)
Potassium: 3.6 mmol/L (ref 3.5–5.1)
Sodium: 138 mmol/L (ref 135–145)
Total Bilirubin: 0.4 mg/dL (ref 0.3–1.2)
Total Protein: 6.1 g/dL — ABNORMAL LOW (ref 6.5–8.1)

## 2020-07-27 LAB — IRON AND TIBC
Iron: 42 ug/dL (ref 42–163)
Saturation Ratios: 18 % — ABNORMAL LOW (ref 20–55)
TIBC: 230 ug/dL (ref 202–409)
UIBC: 188 ug/dL (ref 117–376)

## 2020-07-27 LAB — FERRITIN: Ferritin: 200 ng/mL (ref 24–336)

## 2020-07-27 LAB — RETICULOCYTES
Immature Retic Fract: 5.4 % (ref 2.3–15.9)
RBC.: 4.76 MIL/uL (ref 4.22–5.81)
Retic Count, Absolute: 55.7 10*3/uL (ref 19.0–186.0)
Retic Ct Pct: 1.2 % (ref 0.4–3.1)

## 2020-07-27 LAB — PLATELET BY CITRATE

## 2020-07-28 ENCOUNTER — Telehealth: Payer: Self-pay

## 2020-07-28 NOTE — Telephone Encounter (Signed)
NO 07/27/20 LOS   Edward Nelson

## 2020-07-29 NOTE — Progress Notes (Signed)
Hematology and Oncology Follow Up Visit  Edward Nelson [id number] [birth year] 55 y.o. 07/29/2020   Principle Diagnosis:   Iron deficiency-no anemia  Current Therapy:    Observation     Interim History:  Edward Nelson is back for a second office visit.  We first saw him back in September.  At that time, he was referred to [place] because he apparently was found to have some iron deficiency.  He was not anemic.  He had upper and lower endoscopies back in May 2021.  He has some Barrett's esophagus.  He has been doing well.  He has had no problems since we last saw him.  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  He has had no obvious bleeding.  Is been no melena or hematochezia.  We saw him back in September, his ferritin was 152 with iron saturation of 20%.  He had an erythropoietin level 11.4.  Again, he has had a good appetite.  He is not a vegetarian.  Has had no weight loss or weight gain.  He has had no cough or shortness of breath.  He has had no problems with the COVID.  Currently, I would say his performance status is ECOG 0.   Medications:  Current Outpatient Medications:  .  alfuzosin (UROXATRAL) 10 MG 24 hr tablet, Take 10 mg by mouth daily., Disp: , Rfl:  .  desonide (DESOWEN) 0.05 % cream, Apply to affected areas twice a day until smooth. For face, nipples, genitals., Disp: , Rfl:  .  fexofenadine (ALLEGRA) 180 MG tablet, Take by mouth daily., Disp: , Rfl:  .  Multiple Vitamin (MULTIVITAMIN) capsule, Take 1 capsule by mouth daily., Disp: , Rfl:  .  omeprazole (PRILOSEC) 40 MG capsule, Take 1 capsule (40 mg total) by mouth daily., Disp: 90 capsule, Rfl: 3 .  TADALAFIL PO, Take 5 mg by mouth as needed., Disp: , Rfl:  .  triamcinolone (KENALOG) 0.1 %, Apply 1 application topically 2 (two) times daily. Apply to back x 14 days, Disp: 30 g, Rfl: 0  Allergies:  Allergies  Allergen Reactions  . Cayenne Itching  . Penicillins Other (See Comments)     Not known Not known   . Polyethylene Glycol   . Shellac Other (See Comments)    Positive patch test Positive patch test Positive patch test Positive patch test   . Tetanus Toxoids Other (See Comments)    Not known Not known   . Tetanus Toxoid     Past Medical History, Surgical history, Social history, and Family History were reviewed and updated.  Review of Systems: Review of Systems  Constitutional: Negative.   HENT:  Negative.   Eyes: Negative.   Respiratory: Negative.   Cardiovascular: Negative.   Gastrointestinal: Negative.   Endocrine: Negative.   Genitourinary: Negative.    Musculoskeletal: Negative.   Skin: Negative.   Neurological: Negative.   Hematological: Negative.   Psychiatric/Behavioral: Negative.     Physical Exam:  weight is 201 lb 6.4 oz (91.4 kg). His oral temperature is 98.4 F (36.9 C). His blood pressure is 140/84 and his pulse is 88. His respiration is 20.   Wt Readings from Last 3 Encounters:  07/27/20 201 lb 6.4 oz (91.4 kg)  06/25/20 200 lb 8 oz (90.9 kg)  05/12/20 204 lb (92.5 kg)    Physical Exam Vitals reviewed.  HENT:     Head: Normocephalic and atraumatic.  Eyes:  Pupils: Pupils are equal, round, and reactive to light.  Cardiovascular:     Rate and Rhythm: Normal rate and regular rhythm.     Heart sounds: Normal heart sounds.  Pulmonary:     Effort: Pulmonary effort is normal.     Breath sounds: Normal breath sounds.  Abdominal:     General: Bowel sounds are normal.     Palpations: Abdomen is soft.  Musculoskeletal:        General: No tenderness or deformity. Normal range of motion.     Cervical back: Normal range of motion.  Lymphadenopathy:     Cervical: No cervical adenopathy.  Skin:    General: Skin is warm and dry.     Findings: No erythema or rash.  Neurological:     Mental Status: He is alert and oriented to person, place, and time.  Psychiatric:        Behavior: Behavior normal.        Thought  Content: Thought content normal.        Judgment: Judgment normal.      Lab Results  Component Value Date   WBC 4.6 07/27/2020   HGB 14.4 07/27/2020   HCT 41.9 07/27/2020   MCV 88.4 07/27/2020   PLT 143 (L) 07/27/2020     Chemistry      Component Value Date/Time   NA 138 07/27/2020 0840   K 3.6 07/27/2020 0840   CL 104 07/27/2020 0840   CO2 27 07/27/2020 0840   BUN 13 07/27/2020 0840   CREATININE 1.04 07/27/2020 0840      Component Value Date/Time   CALCIUM 9.1 07/27/2020 0840   ALKPHOS 84 07/27/2020 0840   AST 14 (L) 07/27/2020 0840   ALT 16 07/27/2020 0840   BILITOT 0.4 07/27/2020 0840      Impression and Plan: Edward Nelson is a a very nice 55 year old white male.  I really cannot find any obvious hematologic issue.  He does not have a myeloproliferative problem.  A look at his blood smear.  His blood smear is relatively benign.  I do not see any nucleated red blood cells.  He had no teardrop cells.  His white blood cells were mature without hypersegmented polys.  Again, I do not see any issue with Edward Nelson.  His iron studies do show some mild anemia with a iron saturation of 18% today.  However, he is not anemic.  He is not symptomatic.  He is not bleeding.  I do not see any evidence of a malignancy.  Again I just do not think we are improving or adding to his health care.  I just do not see a problem with him having a minimally elevated iron saturation.  As nice as he is, I just do not think we have to see him back in the office.  I do still think that we are making in addition to his health care.  I told him that he can always come back to see [place] if there are any problems down the road.  [name], MD 3/31/20225:14 PM

## 2020-07-30 ENCOUNTER — Ambulatory Visit (INDEPENDENT_AMBULATORY_CARE_PROVIDER_SITE_OTHER): Payer: BC Managed Care – PPO

## 2020-07-30 ENCOUNTER — Other Ambulatory Visit: Payer: Self-pay | Admitting: Podiatrist

## 2020-07-30 ENCOUNTER — Encounter: Payer: Self-pay | Admitting: Podiatrist

## 2020-07-30 ENCOUNTER — Other Ambulatory Visit: Payer: Self-pay

## 2020-07-30 ENCOUNTER — Ambulatory Visit: Payer: BC Managed Care – PPO | Admitting: Podiatrist

## 2020-07-30 DIAGNOSIS — M76822 Posterior tibial tendinitis, left leg: Secondary | ICD-10-CM | POA: Diagnosis not present

## 2020-07-30 DIAGNOSIS — M7752 Other enthesopathy of left foot: Secondary | ICD-10-CM

## 2020-07-30 DIAGNOSIS — Q742 Other congenital malformations of lower limb(s), including pelvic girdle: Secondary | ICD-10-CM | POA: Diagnosis not present

## 2020-07-30 DIAGNOSIS — I872 Venous insufficiency (chronic) (peripheral): Secondary | ICD-10-CM | POA: Insufficient documentation

## 2020-07-30 DIAGNOSIS — M79672 Pain in left foot: Secondary | ICD-10-CM

## 2020-07-30 DIAGNOSIS — I8390 Asymptomatic varicose veins of unspecified lower extremity: Secondary | ICD-10-CM | POA: Insufficient documentation

## 2020-07-30 MED ORDER — MELOXICAM 15 MG PO TABS: 15.0000 mg | ORAL_TABLET | Freq: Every day | ORAL | 2 refills | Status: DC

## 2020-07-30 NOTE — Progress Notes (Signed)
Chief Complaint  Patient presents with  . Pain    Lt medial foot pain radiates to dorsal midfoot x nov (on and off0 -no injury; 7/10 constant-sharp pain -w/ swelling and numbness and burning (Lt hallux) Tx: prednison (Rx'd by PCP) -pt states PCP took xrays in Feb and couldn't find anything      HPI: Patient is 55 y.o. male who presents today for the concerns as listed above. He relates he has had pain since November with swelling and numbness and burning on the left great toe. He tried some prednisone but states it wasn't very helpful.  He wears steel toe shoes at work and works at night.    Patient Active Problem List   Diagnosis Date Noted  . 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 Constipation due to outlet dysfunction   . Dyssynergic defecation   . Sebaceous cyst 01/20/2020  . Adverse effect of other viral vaccines, subsequent encounter 10/30/2019  . Heartburn 06/16/2019  . Pruritic rash 06/16/2019  . History of adverse drug reaction 06/16/2019  . History of smoking 06/16/2019  . Loose body in knee, left knee 04/26/2018  . Chronic prostatitis 08/20/2015  . Erectile dysfunction 08/20/2015  . Other allergic rhinitis 07/20/2009  . Hemorrhoids 08/30/2007  . GERD 08/30/2007  . HIATAL HERNIA 08/30/2007  . Anxiety state 08/15/2007  . Dermatophytosis 07/22/2007    Current Outpatient Medications on File Prior to Visit  Medication Sig Dispense Refill  . alfuzosin (UROXATRAL) 10 MG 24 hr tablet Take 10 mg by mouth daily.    Marland Kitchen desonide (DESOWEN) 0.05 % cream Apply to affected areas twice a day until smooth. For face, nipples, genitals.    . fexofenadine (ALLEGRA) 180 MG tablet Take by mouth daily.    . Multiple Vitamin (MULTIVITAMIN) capsule Take 1 capsule by mouth daily.    Marland Kitchen omeprazole (PRILOSEC) 40 MG capsule Take 1 capsule (40 mg total) by mouth daily. 90 capsule 3  . TADALAFIL PO Take 5 mg by mouth as needed.    .  triamcinolone (KENALOG) 0.1 % Apply 1 application topically 2 (two) times daily. Apply to back x 14 days 30 g 0   No current facility-administered medications on file prior to visit.    Allergies  Allergen Reactions  . Cayenne Itching  . Methyldibromoglutaronitrile Other (See Comments)    Positive patch test Positive patch test Positive patch test Positive patch test   . Nickel Other (See Comments)    Positive patch test Positive patch test Positive patch test Positive patch test   . Penicillins Other (See Comments)    Not known Not known   . Polyethylene Glycol   . Propylene Glycol Other (See Comments) and Rash    Positive patch test H/o positive patch test Positive patch test   . Shellac Other (See Comments)    Positive patch test Positive patch test Positive patch test Positive patch test   . Tetanus Toxoids Other (See Comments)    Not known Not known   . Other Other (See Comments)    Paraphenylenediamine- Positive patch test Paraphenylenediamine- Positive patch test   . Tetanus Toxoid     Review of Systems No fevers, chills, nausea, muscle aches, no difficulty breathing, no calf pain, no chest pain or shortness of breath.   Physical Exam  GENERAL APPEARANCE: Alert, conversant. Appropriately groomed. No acute distress.   VASCULAR: Pedal pulses palpable DP and PT bilateral.  Capillary refill  time is immediate to all digits,  Proximal to distal cooling it warm to warm.  Digital perfusion adequate.   NEUROLOGIC: sensation is intact to 5.07 monofilament at 5/5 sites bilateral.  Light touch is intact bilateral, vibratory sensation intact bilateral  MUSCULOSKELETAL: acceptable muscle strength, tone and stability bilateral.  No gross boney pedal deformities noted.  Pain on palpation along the navicular at the insertion of the posterior tibial tendon and along its course proximally.  Pain with palpation along the tendon.  Pain with standing and with going up on  toes-  Unable to do a single heel lift due to pain. Swelling is noted in this area as well.  DERMATOLOGIC: skin is warm, supple, and dry.  No open lesions noted.  No rash, no pre ulcerative lesions. Digital nails are asymptomatic.    Radiographic exam:  Normal osseous mineralization.  No fracture or dislocation or acute osseous abnormalities present.  Joint spaces are normal.  Accessory navicular bone is present -  Does not appear fractured. Although it is relatively large.    Assessment    ICD-10-CM   1. Tendonitis of ankle, left  M77.52   2. Posterior tibial tendinitis of left lower extremity  M76.822   3. 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  He just purchased an insert from a company called [employer] that he states helps support his arch in his work shoes.  I also recommended an ankle brace to help support the posterior tendon on the left.  I also recommended an antiinflammatory for 2-3 weeks to help with the inflammation as well as wearing compression socks.  We discussed the possibility of an injection in the future depending on how the foot does with bracing.  He will return in 3-4 weeks for recheck of the foot.

## 2020-07-30 NOTE — Patient Instructions (Signed)
Posterior Tibial Tendinitis Posterior tibial tendinitis is irritation of a tendon called the posterior tibial tendon. Your posterior tibial tendon is a cord-like tissue that connects bones of your lower leg and foot to a muscle that:  Supports your arch.  Helps you raise up on your toes.  Helps you turn your foot down and in. This condition causes foot and ankle pain. It can also lead to a flat foot. What are the causes? This condition is most often caused by repeated stress to the tendon (overuse injury). It can also be caused by a sudden injury that stresses the tendon, such as landing on your foot after jumping or falling. What increases the risk? This condition is more likely to develop in:  People who play a sport that involves putting a lot of pressure on the feet, such as: ? Basketball. ? Tennis. ? Soccer. ? Hockey.  Runners.  Females who are older than 57 years of age and are overweight.  People with diabetes.  People with decreased foot stability.  People with flat feet. What are the signs or symptoms? Symptoms include:  Pain in the inner ankle.  Pain at the arch of your foot.  Pain that gets worse with running, walking, or standing.  Swelling on the inside of your ankle and foot.  Weakness in your ankle or foot.  Inability to stand up on tiptoe.  Flattening of the arch of your foot. How is this diagnosed? This condition may be diagnosed based on:  Your symptoms.  Your medical history.  A physical exam.  Tests, such as: ? X-ray. ? MRI. ? Ultrasound. How is this treated? This condition may be treated by:  Putting ice to the injured area.  Taking NSAIDs, such as ibuprofen, to reduce pain and swelling.  Wearing a special shoe or shoe insert to support your arch (orthotic).  Having physical therapy.  Replacing high-impact exercise with low-impact exercise, such as swimming or cycling. If your symptoms do not improve with these treatments, you  may need to wear a splint, removable walking boot, or short leg cast for 6-8 weeks to keep your foot and ankle still (immobilized). Follow these instructions at home: If you have a cast, splint, or boot:  Keep it clean and dry.  Check the skin around it every day. Tell your health care provider about any concerns. If you have a cast:  Do not stick anything inside it to scratch your skin. Doing that increases your risk of infection.  You may put lotion on dry skin around the edges of the cast. Do not put lotion on the skin underneath the cast. If you have a splint or boot:  Wear it as told by your health care provider. Remove it only as told by your health care provider.  Loosen it if your toes tingle, become numb, or turn cold and blue. Bathing  Do not take baths, swim, or use a hot tub until your health care provider approves. Ask your health care provider if you may take showers.  If your cast, splint, or boot is not waterproof: ? Do not let it get wet. ? Cover it with a waterproof covering while you take a bath or a shower. Managing pain and swelling  If directed, put ice on the injured area. ? If you have a removable splint or boot, remove it as told by your health care provider. ? Put ice in a plastic bag. ? Place a towel between your skin and the  bag or between your cast and the bag. ? Leave the ice on for 20 minutes, 2-3 times a day.  Move your toes often to reduce stiffness and swelling.  Raise (elevate) the injured area above the level of your heart while you are sitting or lying down.   Activity  Do not use the injured foot to support your body weight until your health care provider says that you can. Use crutches as told by your health care provider.  Do not do activities that make pain or swelling worse.  Ask your health care provider when it is safe to drive if you have a cast, splint, or boot on your foot.  Return to your normal activities as told by your  health care provider. Ask your health care provider what activities are safe for you.  Do exercises as told by your health care provider. General instructions  Take over-the-counter and prescription medicines only as told by your health care provider.  If you have an orthotic, use it as told by your health care provider.  Keep all follow-up visits as told by your health care provider. This is important. How is this prevented?  Wear footwear that is appropriate to your athletic activity.  Avoid athletic activities that cause pain or swelling in your ankle or foot.  Before being active, do range-of-motion and stretching exercises.  If you develop pain or swelling while training, stop training.  If you have pain or swelling that does not improve after a few days of rest, see your health care provider.  If you start a new athletic activity, start gradually so you can build up your strength and flexibility. Contact a health care provider if:  Your symptoms get worse.  Your symptoms do not improve in 6-8 weeks.  You develop new, unexplained symptoms.  Your splint, boot, or cast gets damaged. Summary  Posterior tibial tendinitis is irritation of a tendon called the posterior tibial tendon.  This condition is most often caused by repeated stress to the tendon (overuse injury).  This condition causes foot pain and ankle pain. It can also lead to a flat foot.  This condition may be treated by not doing high-impact activities, applying ice, having physical therapy, wearing orthotics, and wearing a cast, splint, or boot if needed. This information is not intended to replace advice given to you by your health care provider. Make sure you discuss any questions you have with your health care provider. Document Revised: 08/13/2018 Document Reviewed: 06/20/2018 Elsevier Patient Education  [employer].

## 2020-08-02 ENCOUNTER — Encounter: Payer: Self-pay | Admitting: Gastroenterology

## 2020-08-02 NOTE — Progress Notes (Signed)
    August 02, 2020  To Whom It May Concern:  Edward Nelson   02/13/1966 has been under my evaluation for unexplained iron deficiency not responding to oral therapy. Initial labs 06/2019 showed an iron saturation of 9.6 and an iron of 24. No source was identified on EGD and colonoscopy performed 09/19/19. Follow-up labs on oral iron 10/2019 showed an iron saturation of 13 and an iron of 39. During an office visit 11/19/19 [name] reported intermittent bright red blood on the toilet paper.   Capsule endoscopy was ordered for further evaluation of the unexplained iron deficiency not responding to oral therapy. My staff called for authorization 11/27/19. Reference number [id number] showed that the code did not require authorization. Capsule endoscopy was performed 12/04/19. [name] was also seen in consultation by hematology.  Please let me know if you need additional information.  Sincerely,  Joelene Millin L. Tarri Glenn, MD, MPH [facility]

## 2020-08-03 DIAGNOSIS — M62838 Other muscle spasm: Secondary | ICD-10-CM | POA: Diagnosis not present

## 2020-08-03 DIAGNOSIS — M6289 Other specified disorders of muscle: Secondary | ICD-10-CM | POA: Diagnosis not present

## 2020-08-03 DIAGNOSIS — M6281 Muscle weakness (generalized): Secondary | ICD-10-CM | POA: Diagnosis not present

## 2020-08-03 DIAGNOSIS — R102 Pelvic and perineal pain: Secondary | ICD-10-CM | POA: Diagnosis not present

## 2020-08-31 ENCOUNTER — Ambulatory Visit: Payer: BC Managed Care – PPO | Admitting: Podiatry

## 2020-09-10 DIAGNOSIS — N401 Enlarged prostate with lower urinary tract symptoms: Secondary | ICD-10-CM | POA: Diagnosis not present

## 2020-09-10 DIAGNOSIS — R3916 Straining to void: Secondary | ICD-10-CM | POA: Diagnosis not present

## 2020-10-03 DIAGNOSIS — R42 Dizziness and giddiness: Secondary | ICD-10-CM | POA: Diagnosis not present

## 2020-10-03 DIAGNOSIS — B349 Viral infection, unspecified: Secondary | ICD-10-CM | POA: Diagnosis not present

## 2020-10-03 DIAGNOSIS — R6883 Chills (without fever): Secondary | ICD-10-CM | POA: Diagnosis not present

## 2020-10-04 ENCOUNTER — Encounter: Payer: Self-pay | Admitting: Family Medicine

## 2020-10-15 DIAGNOSIS — M6281 Muscle weakness (generalized): Secondary | ICD-10-CM | POA: Diagnosis not present

## 2020-10-15 DIAGNOSIS — M62838 Other muscle spasm: Secondary | ICD-10-CM | POA: Diagnosis not present

## 2020-10-15 DIAGNOSIS — M6289 Other specified disorders of muscle: Secondary | ICD-10-CM | POA: Diagnosis not present

## 2020-10-15 DIAGNOSIS — R102 Pelvic and perineal pain: Secondary | ICD-10-CM | POA: Diagnosis not present

## 2020-10-22 DIAGNOSIS — R102 Pelvic and perineal pain: Secondary | ICD-10-CM | POA: Diagnosis not present

## 2020-10-22 DIAGNOSIS — R3916 Straining to void: Secondary | ICD-10-CM | POA: Diagnosis not present

## 2020-10-22 DIAGNOSIS — M6281 Muscle weakness (generalized): Secondary | ICD-10-CM | POA: Diagnosis not present

## 2020-10-22 DIAGNOSIS — M545 Low back pain, unspecified: Secondary | ICD-10-CM | POA: Diagnosis not present

## 2020-11-06 DIAGNOSIS — S29011A Strain of muscle and tendon of front wall of thorax, initial encounter: Secondary | ICD-10-CM | POA: Diagnosis not present

## 2020-11-19 ENCOUNTER — Ambulatory Visit: Payer: BC Managed Care – PPO | Admitting: Family Medicine

## 2020-11-19 ENCOUNTER — Encounter: Payer: Self-pay | Admitting: Family Medicine

## 2020-11-19 ENCOUNTER — Ambulatory Visit (INDEPENDENT_AMBULATORY_CARE_PROVIDER_SITE_OTHER)
Admission: RE | Admit: 2020-11-19 | Discharge: 2020-11-19 | Disposition: A | Discharge status: Home / Self Care | Payer: BC Managed Care – PPO | Source: Ambulatory Visit | Attending: Family Medicine | Admitting: Family Medicine

## 2020-11-19 ENCOUNTER — Other Ambulatory Visit: Payer: Self-pay

## 2020-11-19 VITALS — BP 130/62 | HR 85 | Temp 98.1°F | Ht 72.75 in | Wt 199.2 lb

## 2020-11-19 DIAGNOSIS — J329 Chronic sinusitis, unspecified: Secondary | ICD-10-CM

## 2020-11-19 DIAGNOSIS — R059 Cough, unspecified: Secondary | ICD-10-CM

## 2020-11-19 DIAGNOSIS — R0781 Pleurodynia: Secondary | ICD-10-CM

## 2020-11-19 DIAGNOSIS — R42 Dizziness and giddiness: Secondary | ICD-10-CM

## 2020-11-19 DIAGNOSIS — Z23 Encounter for immunization: Secondary | ICD-10-CM | POA: Diagnosis not present

## 2020-11-19 DIAGNOSIS — J439 Emphysema, unspecified: Secondary | ICD-10-CM | POA: Diagnosis not present

## 2020-11-19 DIAGNOSIS — K219 Gastro-esophageal reflux disease without esophagitis: Secondary | ICD-10-CM

## 2020-11-19 MED ORDER — DOXYCYCLINE HYCLATE 100 MG PO TABS: 100.0000 mg | ORAL_TABLET | Freq: Two times a day (BID) | ORAL | 0 refills | Status: AC

## 2020-11-19 MED ORDER — MECLIZINE HCL 25 MG PO TABS: 25.0000 mg | ORAL_TABLET | Freq: Three times a day (TID) | ORAL | 0 refills | Status: AC | PRN

## 2020-11-19 NOTE — Patient Instructions (Signed)
Epley maneuver: [facility] video on you tube  Somersault maneuver /half somersault carol foster  Take the meclizine to help with dizziness  Benign Positional Vertigo  Vertigo is the feeling that you or your surroundings are moving when they are not. Benign positional vertigo is the most common form of vertigo. The cause of this condition is not serious (is benign). This condition is triggered by certain movements and positions (is positional). This condition can be dangerous if it occurs while you are doing something that could endanger you or others, such as driving. What are the causes? In many cases, the cause of this condition is not known. It may be caused by a disturbance in an area of the inner ear that helps your brain to sense movement and balance. This disturbance can be caused by a viral infection (labyrinthitis), head injury, or repetitive motion. What increases the risk? This condition is more likely to develop in: Women. People who are 24 years of age or older.   What are the signs or symptoms? Symptoms of this condition usually happen when you move your head or your eyes in different directions. Symptoms may start suddenly, and they usually last for less than a minute. Symptoms may include: Loss of balance and falling. Feeling like you are spinning or moving. Feeling like your surroundings are spinning or moving. Nausea and vomiting. Blurred vision. Dizziness. Involuntary eye movement (nystagmus).   Symptoms can be mild and cause only slight annoyance, or they can be severe and interfere with daily life. Episodes of benign positional vertigo may return (recur) over time, and they may be triggered by certain movements. Symptoms may improve over time. How is this diagnosed? This condition is usually diagnosed by medical history and a physical exam of the head, neck, and ears. You may be referred to a health care provider who specializes in ear, nose, and throat (ENT)  problems (otolaryngologist) or a provider who specializes in disorders of the nervous system (neurologist). You may have additional testing, including: MRI. A CT scan. Eye movement tests. Your health care provider may ask you to change positions quickly while he or she watches you for symptoms of benign positional vertigo, such as nystagmus. Eye movement may be tested with an electronystagmogram (ENG), caloric stimulation, the Dix-Hallpike test, or the roll test. An electroencephalogram (EEG). This records electrical activity in your brain. Hearing tests.   How is this treated? Usually, your health care provider will treat this by moving your head in specific positions to adjust your inner ear back to normal. Surgery may be needed in severe cases, but this is rare. In some cases, benign positional vertigo may resolve on its own in 2-4 weeks. Follow these instructions at home: Safety Move slowly.Avoid sudden body or head movements. Avoid driving. Avoid operating heavy machinery. Avoid doing any tasks that would be dangerous to you or others if a vertigo episode would occur. If you have trouble walking or keeping your balance, try using a cane for stability. If you feel dizzy or unstable, sit down right away. Return to your normal activities as told by your health care provider. Ask your health care provider what activities are safe for you. General instructions Take over-the-counter and prescription medicines only as told by your health care provider. Avoid certain positions or movements as told by your health care provider. Drink enough fluid to keep your urine clear or pale yellow. Keep all follow-up visits as told by your health care provider. This is important.  Contact a health care provider if: You have a fever. Your condition gets worse or you develop new symptoms. Your family or friends notice any behavioral changes. Your nausea or vomiting gets worse. You have numbness or a "pins  and needles" sensation. Get help right away if: You have difficulty speaking or moving. You are always dizzy. You faint. You develop severe headaches. You have weakness in your legs or arms. You have changes in your hearing or vision. You develop a stiff neck. You develop sensitivity to light. This information is not intended to replace advice given to you by your health care provider. Make sure you discuss any questions you have with your health care provider. Document Released: 01/23/2006 Document Revised: 09/23/2015 Document Reviewed: 08/10/2014 Elsevier Interactive Patient Education  2018 [employer].    How to Perform the [place] vertigo description and maneuver  The Epley maneuver is an exercise that relieves symptoms of vertigo. Vertigo is the feeling that you or your surroundings are moving when they are not. When you feel vertigo, you may feel like the room is spinning and have trouble walking. Dizziness is a little different than vertigo. When you are dizzy, you may feel unsteady or light-headed. You can do this maneuver at home whenever you have symptoms of vertigo. You can do it up to 3 times a day until your symptoms go away. Even though the Epley maneuver may relieve your vertigo for a few weeks, it is possible that your symptoms will return. This maneuver relieves vertigo, but it does not relieve dizziness. What are the risks? If it is done correctly, the Epley maneuver is considered safe. Sometimes it can lead to dizziness or nausea that goes away after a short time. If you develop other symptoms, such as changes in vision, weakness, or numbness, stop doing the maneuver and call your health care provider. How to perform the Epley maneuver Sit on the edge of a bed or table with your back straight and your legs extended or hanging over the edge of the bed or table. Turn your head halfway toward the affected ear or side. Lie backward quickly with  your head turned until you are lying flat on your back. You may want to position a pillow under your shoulders. Hold this position for 30 seconds. You may experience an attack of vertigo. This is normal. Turn your head to the opposite direction until your unaffected ear is facing the floor. Hold this position for 30 seconds. You may experience an attack of vertigo. This is normal. Hold this position until the vertigo stops. Turn your whole body to the same side as your head. Hold for another 30 seconds. Sit back up. You can repeat this exercise up to 3 times a day. Follow these instructions at home: After doing the Epley maneuver, you can return to your normal activities. Ask your health care provider if there is anything you should do at home to prevent vertigo. He or she may recommend that you: Keep your head raised (elevated) with two or more pillows while you sleep. Do not sleep on the side of your affected ear. Get up slowly from bed. Avoid sudden movements during the day. Avoid extreme head movement, like looking up or bending over. Contact a health care provider if: Your vertigo gets worse. You have other symptoms, including: Nausea. Vomiting. Headache. Get help right away if: You have vision changes. You have a severe or worsening headache or neck pain.  You cannot stop vomiting. You have new numbness or weakness in any part of your body. Summary Vertigo is the feeling that you or your surroundings are moving when they are not. The Epley maneuver is an exercise that relieves symptoms of vertigo. If the Epley maneuver is done correctly, it is considered safe. You can do it up to 3 times a day. This information is not intended to replace advice given to you by your health care provider. Make sure you discuss any questions you have with your health care provider. Document Released: 04/22/2013 Document Revised: 03/07/2016 Document Reviewed: 03/07/2016 Elsevier Interactive Patient  Education  2017 [employer].

## 2020-11-19 NOTE — Progress Notes (Signed)
Edward Nelson DOB: 04/21/1966 Encounter date: 11/19/2020  This is a 55 y.o. male who presents with Chief Complaint  Patient presents with   Dizziness    X2 months          Chest Pain    Patient complains of left-sided chest pain x2 weeks, noticed after sneezing, seen at an urgent care, diagnosed with muscle strain, advised to take Ibuprofen    History of present illness: Last visit with me was 06/2020 for physical.  At that time we discussed left foot swelling and pain.  He saw podiatry in April.  Plan was for him to take an anti-inflammatory for 2 to 3 weeks, wear compression sock, and follow-up for recheck. Foot has been fine; still wears brace with compression on occasion; usually to work.   He was working with pelvic floor therapy status post UroLift procedure. Completed this. Initially seemed like it helped but then felt like it loosened up and he started having groin pain.   He has followed with oncology for iron deficiency And all evaluation per them looks stable and without concern.  No need for further follow-up.  He is due for second shingles vaccine.  GERD: Omeprazole 40 mg daily  Feeling a little light headed at times, spots in eyes, ringing in ears. Just comes on sometimes; no pattern. Sometimes feels like he could pass out, but hasn't passed out. Does have a little spinning. Might note more with sudden movement. Occasional headache, but not associated with these symptoms. Feels hearing is a little less. Notes this in both ears.   Did fast med in [place] a couple of weeks ago. Just wasn't feeling right. Sneezed when he woke up and got pain in left side of chest. Didn't have chest xr. Hurts sometimes - like when he takes deep breath. Rare cough. Has improved. Was really painful at first (could hardly sit down). Was told to take ibuprofen, tylenol; thought pulled muscle. Initially pain was at 10/10; now with breath is still at 7. Hard to even get up after initial pain.      Allergies  Allergen Reactions   Cayenne Itching   Methyldibromoglutaronitrile Other (See Comments)    Positive patch test Positive patch test Positive patch test Positive patch test    Nickel Other (See Comments)    Positive patch test Positive patch test Positive patch test Positive patch test    Penicillins Other (See Comments)    Not known Not known    Polyethylene Glycol    Propylene Glycol Other (See Comments) and Rash    Positive patch test H/o positive patch test Positive patch test    Shellac Other (See Comments)    Positive patch test Positive patch test Positive patch test Positive patch test    Tetanus Toxoids Other (See Comments)    Not known Not known    Other Other (See Comments)    Paraphenylenediamine- Positive patch test Paraphenylenediamine- Positive patch test    Tetanus Toxoid    Current Meds  Medication Sig   alfuzosin (UROXATRAL) 10 MG 24 hr tablet Take 10 mg by mouth daily.   desonide (DESOWEN) 0.05 % cream Apply to affected areas twice a day until smooth. For face, nipples, genitals.   doxycycline (VIBRA-TABS) 100 MG tablet Take 1 tablet (100 mg total) by mouth 2 (two) times daily for 7 days.   fexofenadine (ALLEGRA) 180 MG tablet Take by mouth daily.   meclizine (ANTIVERT) 25 MG tablet Take 1 tablet (  25 mg total) by mouth 3 (three) times daily as needed for dizziness.   meloxicam (MOBIC) 15 MG tablet Take 1 tablet (15 mg total) by mouth daily. Take with food-  Take for the next 2-3 weeks then may wean off.   Multiple Vitamin (MULTIVITAMIN) capsule Take 1 capsule by mouth daily.   omeprazole (PRILOSEC) 40 MG capsule Take 1 capsule (40 mg total) by mouth daily.   TADALAFIL PO Take 5 mg by mouth as needed.   triamcinolone (KENALOG) 0.1 % Apply 1 application topically 2 (two) times daily. Apply to back x 14 days    Review of Systems  Constitutional:  Negative for chills, fatigue and fever.  Respiratory:  Negative for cough, chest  tightness, shortness of breath and wheezing.   Cardiovascular:  Negative for chest pain, palpitations and leg swelling.  Neurological:  Positive for dizziness and light-headedness. Negative for headaches.   Objective:  BP 130/62 (BP Location: Left Arm, Patient Position: Sitting, Cuff Size: Large)   Pulse 85   Temp 98.1 F (36.7 C) (Oral)   Ht 6' 0.75" (1.848 m)   Wt 199 lb 3.2 oz (90.4 kg)   SpO2 96%   BMI 26.46 kg/m   Weight: 199 lb 3.2 oz (90.4 kg)   BP Readings from Last 3 Encounters:  11/19/20 130/62  07/27/20 140/84  06/25/20 120/68   Wt Readings from Last 3 Encounters:  11/19/20 199 lb 3.2 oz (90.4 kg)  07/27/20 201 lb 6.4 oz (91.4 kg)  06/25/20 200 lb 8 oz (90.9 kg)    Physical Exam Constitutional:      General: He is not in acute distress.    Appearance: He is well-developed. He is not diaphoretic.  HENT:     Head: Normocephalic and atraumatic.     Right Ear: Tympanic membrane, ear canal and external ear normal. No middle ear effusion. There is no impacted cerumen.     Left Ear: Tympanic membrane, ear canal and external ear normal.  No middle ear effusion. There is no impacted cerumen.  Eyes:     Conjunctiva/sclera: Conjunctivae normal.     Pupils: Pupils are equal, round, and reactive to light.  Neck:     Thyroid: No thyromegaly.  Cardiovascular:     Rate and Rhythm: Normal rate and regular rhythm.     Heart sounds: Normal heart sounds. No murmur heard.   No friction rub. No gallop.  Pulmonary:     Effort: Pulmonary effort is normal. No respiratory distress.     Breath sounds: Normal breath sounds. No wheezing or rales.  Musculoskeletal:     Cervical back: Neck supple.  Lymphadenopathy:     Cervical: No cervical adenopathy.  Skin:    General: Skin is warm and dry.  Neurological:     Mental Status: He is alert and oriented to person, place, and time.     Cranial Nerves: Cranial nerves are intact. No cranial nerve deficit.     Motor: Motor function is  intact. No weakness or abnormal muscle tone.     Coordination: Coordination is intact. Heel to [facility] Test normal.     Gait: Gait is intact. Tandem walk normal.     Deep Tendon Reflexes: Reflexes normal.     Reflex Scores:      Tricep reflexes are 2+ on the right side and 2+ on the left side.      Bicep reflexes are 2+ on the right side and 2+ on the left side.  Brachioradialis reflexes are 2+ on the right side and 2+ on the left side.      Patellar reflexes are 2+ on the right side and 2+ on the left side. Psychiatric:        Behavior: Behavior normal.    Assessment/Plan  1. Gastroesophageal reflux disease, unspecified whether esophagitis present Continue with current omeprazole.   2. Vertigo Suspect relation to sinus/inner ear given sx and exam. Will have him try home epley, somersault and use meclizine as needed. If not improved with this and with sinus treatment he will let me know.   3. Rib pain on left side Still with significant pain. Will r/o fracture.  - DG Ribs Unilateral Left; Future  4. Cough Lungs sound clear on exam; patient admits coughing up increased phlegm.  - DG Chest 2 View; Future  5. Recurrent sinusitis - doxycycline (VIBRA-TABS) 100 MG tablet; Take 1 tablet (100 mg total) by mouth 2 (two) times daily for 7 days.  Dispense: 14 tablet; Refill: 0   Return if symptoms worsen or fail to improve. Bloodwork from march was stable; if sx not improving we can recheck; but I do not suspect this as cause of vertigo today.    Micheline Rough, MD

## 2020-11-19 NOTE — Addendum Note (Signed)
Addended by: Agnes Lawrence on: 11/19/2020 10:37 AM   Modules accepted: Orders

## 2020-12-03 DIAGNOSIS — I83891 Varicose veins of right lower extremities with other complications: Secondary | ICD-10-CM | POA: Diagnosis not present

## 2020-12-22 DIAGNOSIS — N411 Chronic prostatitis: Secondary | ICD-10-CM | POA: Diagnosis not present

## 2020-12-22 DIAGNOSIS — N401 Enlarged prostate with lower urinary tract symptoms: Secondary | ICD-10-CM | POA: Diagnosis not present

## 2020-12-22 DIAGNOSIS — M6289 Other specified disorders of muscle: Secondary | ICD-10-CM | POA: Diagnosis not present

## 2020-12-22 DIAGNOSIS — R300 Dysuria: Secondary | ICD-10-CM | POA: Diagnosis not present

## 2021-01-10 DIAGNOSIS — U071 COVID-19: Secondary | ICD-10-CM | POA: Diagnosis not present

## 2021-01-10 DIAGNOSIS — R509 Fever, unspecified: Secondary | ICD-10-CM | POA: Diagnosis not present

## 2021-01-10 DIAGNOSIS — R519 Headache, unspecified: Secondary | ICD-10-CM | POA: Diagnosis not present

## 2021-01-15 DIAGNOSIS — U071 COVID-19: Secondary | ICD-10-CM | POA: Diagnosis not present

## 2021-01-15 DIAGNOSIS — T7840XA Allergy, unspecified, initial encounter: Secondary | ICD-10-CM | POA: Diagnosis not present

## 2021-01-17 DIAGNOSIS — R42 Dizziness and giddiness: Secondary | ICD-10-CM | POA: Diagnosis not present

## 2021-01-17 DIAGNOSIS — Z79899 Other long term (current) drug therapy: Secondary | ICD-10-CM | POA: Diagnosis not present

## 2021-01-17 DIAGNOSIS — Z91018 Allergy to other foods: Secondary | ICD-10-CM | POA: Diagnosis not present

## 2021-01-17 DIAGNOSIS — Z888 Allergy status to other drugs, medicaments and biological substances status: Secondary | ICD-10-CM | POA: Diagnosis not present

## 2021-01-17 DIAGNOSIS — Z87891 Personal history of nicotine dependence: Secondary | ICD-10-CM | POA: Diagnosis not present

## 2021-01-17 DIAGNOSIS — M791 Myalgia, unspecified site: Secondary | ICD-10-CM | POA: Diagnosis not present

## 2021-01-17 DIAGNOSIS — Z880 Allergy status to penicillin: Secondary | ICD-10-CM | POA: Diagnosis not present

## 2021-01-17 DIAGNOSIS — R638 Other symptoms and signs concerning food and fluid intake: Secondary | ICD-10-CM | POA: Diagnosis not present

## 2021-01-17 DIAGNOSIS — Z91048 Other nonmedicinal substance allergy status: Secondary | ICD-10-CM | POA: Diagnosis not present

## 2021-01-17 DIAGNOSIS — Z887 Allergy status to serum and vaccine status: Secondary | ICD-10-CM | POA: Diagnosis not present

## 2021-01-17 DIAGNOSIS — U071 COVID-19: Secondary | ICD-10-CM | POA: Diagnosis not present

## 2021-01-17 DIAGNOSIS — K219 Gastro-esophageal reflux disease without esophagitis: Secondary | ICD-10-CM | POA: Diagnosis not present

## 2021-01-30 ENCOUNTER — Encounter: Payer: Self-pay | Admitting: Family Medicine

## 2021-01-31 ENCOUNTER — Other Ambulatory Visit: Payer: Self-pay

## 2021-02-01 ENCOUNTER — Telehealth: Payer: Self-pay | Admitting: Family Medicine

## 2021-02-01 ENCOUNTER — Ambulatory Visit: Payer: BC Managed Care – PPO | Admitting: Adult Health

## 2021-02-01 ENCOUNTER — Encounter: Payer: Self-pay | Admitting: Adult Health

## 2021-02-01 DIAGNOSIS — H6690 Otitis media, unspecified, unspecified ear: Secondary | ICD-10-CM

## 2021-02-01 DIAGNOSIS — L578 Other skin changes due to chronic exposure to nonionizing radiation: Secondary | ICD-10-CM | POA: Diagnosis not present

## 2021-02-01 DIAGNOSIS — L821 Other seborrheic keratosis: Secondary | ICD-10-CM | POA: Diagnosis not present

## 2021-02-01 DIAGNOSIS — D225 Melanocytic nevi of trunk: Secondary | ICD-10-CM | POA: Diagnosis not present

## 2021-02-01 DIAGNOSIS — L720 Epidermal cyst: Secondary | ICD-10-CM | POA: Diagnosis not present

## 2021-02-01 MED ORDER — CEFDINIR 300 MG PO CAPS: 300.0000 mg | ORAL_CAPSULE | Freq: Two times a day (BID) | ORAL | 0 refills | Status: AC

## 2021-02-01 NOTE — Patient Instructions (Signed)
You can take Sudafed or use Afrin for the next few days   I am going to prescribe you a antibiotic called Omnicef - take this twice a day with food.

## 2021-02-01 NOTE — Telephone Encounter (Signed)
Noted  

## 2021-02-01 NOTE — Progress Notes (Signed)
Subjective:    Patient ID: Edward Nelson, male    DOB: March 30, 1966, 55 y.o.   MRN: [id number]  HPI  55 year old male who  has a past medical history of Enlarged prostate and GERD (gastroesophageal reflux disease).  He is a patient of Dr. Ethlyn Gallery. I am seeing him today for an acute issue. Over the last three weeks he has been experiencing the feeling of his left ear being full " Like there is water in my ear"  He went to [facility] about 10 days ago and was prescribed Flonase which has not been helping. Denies fevers, chills, or loss of hearing  Review of Systems See HPI   Past Medical History:  Diagnosis Date   Enlarged prostate    treated by urologist with [facility]   GERD (gastroesophageal reflux disease)     Social History   Socioeconomic History   Marital status: Single    Spouse name: Not on file   Number of children: 0   Years of education: Not on file   Highest education level: Not on file  Occupational History   Occupation: Fabrication  Tobacco Use   Smoking status: Former    Packs/day: 1.00    Years: 35.00    Pack years: 35.00    Types: Cigarettes    Start date: 14    Quit date: 2015    Years since quitting: 7.7   Smokeless tobacco: Never  Vaping Use   Vaping Use: Never used  Substance and Sexual Activity   Alcohol use: Yes    Comment: occasionally 1 per day   Drug use: Never   Sexual activity: Not Currently    Partners: Female  Other Topics Concern   Not on file  Social History Narrative   Not on file   Social Determinants of Health   Financial Resource Strain: Not on file  Food Insecurity: Not on file  Transportation Needs: Not on file  Physical Activity: Not on file  Stress: Not on file  Social Connections: Not on file  Intimate Partner Violence: Not on file    Past Surgical History:  Procedure Laterality Date   ANAL RECTAL MANOMETRY N/A 02/04/2020   Procedure: [place];  Surgeon: Thornton Park, MD;   Location: [facility];  Service: Gastroenterology;  Laterality: N/A;   BLADDER SURGERY  2010   Transurethral incision of bladder neck   CERVICAL DISC SURGERY  2013   CYSTOSCOPY WITH INSERTION OF UROLIFT     KNEE ARTHROSCOPY Left    NASAL SINUS SURGERY Left    started bleeding so didn't complete right side   right leg fracture  1986   tibial/fib fracture with surgical correction, plate    Family History  Problem Relation Age of Onset   Breast cancer Mother    Osteoporosis Mother    Hypertension Mother    Arthritis Mother    Cancer - Other Father        oral   Breast cancer Sister    Hypertension Sister    Other Brother        acid reflux   Colon cancer Neg Hx    Rectal cancer Neg Hx    Stomach cancer Neg Hx    Esophageal cancer Neg Hx     Allergies  Allergen Reactions   Cayenne Itching   Methyldibromoglutaronitrile Other (See Comments)    Positive patch test Positive patch test Positive patch test Positive patch test  Nickel Other (See Comments)    Positive patch test Positive patch test Positive patch test Positive patch test    Penicillins Other (See Comments)    Not known Not known    Polyethylene Glycol    Propylene Glycol Other (See Comments) and Rash    Positive patch test H/o positive patch test Positive patch test    Shellac Other (See Comments)    Positive patch test Positive patch test Positive patch test Positive patch test    Tetanus Toxoids Other (See Comments)    Not known Not known    Other Other (See Comments)    Paraphenylenediamine- Positive patch test Paraphenylenediamine- Positive patch test    Tetanus Toxoid     Current Outpatient Medications on File Prior to Visit  Medication Sig Dispense Refill   alfuzosin (UROXATRAL) 10 MG 24 hr tablet Take 10 mg by mouth daily.     desonide (DESOWEN) 0.05 % cream Apply to affected areas twice a day until smooth. For face, nipples, genitals.     fexofenadine (ALLEGRA) 180 MG tablet  Take by mouth daily.     meclizine (ANTIVERT) 25 MG tablet Take 1 tablet (25 mg total) by mouth 3 (three) times daily as needed for dizziness. 30 tablet 0   meloxicam (MOBIC) 15 MG tablet Take 1 tablet (15 mg total) by mouth daily. Take with food-  Take for the next 2-3 weeks then may wean off. 30 tablet 2   Multiple Vitamin (MULTIVITAMIN) capsule Take 1 capsule by mouth daily.     omeprazole (PRILOSEC) 40 MG capsule Take 1 capsule (40 mg total) by mouth daily. 90 capsule 3   TADALAFIL PO Take 5 mg by mouth as needed.     triamcinolone (KENALOG) 0.1 % Apply 1 application topically 2 (two) times daily. Apply to back x 14 days 30 g 0   No current facility-administered medications on file prior to visit.    There were no vitals taken for this visit.      Objective:   Physical Exam Vitals and nursing note reviewed.  Constitutional:      Appearance: Normal appearance.  HENT:     Head: Normocephalic and atraumatic.     Right Ear: Hearing, tympanic membrane, ear canal and external ear normal.     Left Ear: Hearing, ear canal and external ear normal. A middle ear effusion is present. Tympanic membrane is erythematous and bulging.  Neurological:     Mental Status: He is alert.      Assessment & Plan:   1. Acute otitis media, unspecified otitis media type - Will treat for acute otitis media. Can also use Afrin and Sudafed for the next three days.  - cefdinir (OMNICEF) 300 MG capsule; Take 1 capsule (300 mg total) by mouth 2 (two) times daily for 10 days.  Dispense: 20 capsule; Refill: 0   Dorothyann Peng, NP

## 2021-02-01 NOTE — Telephone Encounter (Signed)
Pharmacy call and stated pt is allergic to this Cefdinir  because of penicillin in it .pharmacy stated they need a need prescription .

## 2021-02-02 ENCOUNTER — Telehealth: Payer: Self-pay | Admitting: Family Medicine

## 2021-02-02 NOTE — Telephone Encounter (Signed)
Please advise 

## 2021-02-02 NOTE — Telephone Encounter (Signed)
Patient called stating that he had an appointment with Tommi Rumps and [facility] prescribed him a prescription. His pharmacy called him and told him that they would not dispense cefdinir (OMNICEF) 300 MG capsule to him due to him being allergic to penicillin.   Patient is needing something else called in as soon as possible so that he could start the medication to treat his issue.  Patient uses [facility] [place] .  Please advise.

## 2021-02-02 NOTE — Telephone Encounter (Signed)
Pharmacist stated that Rx is ready for pt. Pt notified of update

## 2021-02-02 NOTE — Telephone Encounter (Signed)
Dr.Koberlein please disregard.   Please advise Tommi Rumps

## 2021-02-22 DIAGNOSIS — H6592 Unspecified nonsuppurative otitis media, left ear: Secondary | ICD-10-CM | POA: Diagnosis not present

## 2021-03-30 DIAGNOSIS — H6592 Unspecified nonsuppurative otitis media, left ear: Secondary | ICD-10-CM | POA: Diagnosis not present

## 2021-03-30 DIAGNOSIS — H90A32 Mixed conductive and sensorineural hearing loss, unilateral, left ear with restricted hearing on the contralateral side: Secondary | ICD-10-CM | POA: Diagnosis not present

## 2021-03-30 DIAGNOSIS — Z8616 Personal history of COVID-19: Secondary | ICD-10-CM | POA: Diagnosis not present

## 2021-03-30 DIAGNOSIS — Z87891 Personal history of nicotine dependence: Secondary | ICD-10-CM | POA: Diagnosis not present

## 2021-03-30 DIAGNOSIS — H938X3 Other specified disorders of ear, bilateral: Secondary | ICD-10-CM | POA: Diagnosis not present

## 2021-04-06 DIAGNOSIS — H6592 Unspecified nonsuppurative otitis media, left ear: Secondary | ICD-10-CM | POA: Diagnosis not present

## 2021-04-12 DIAGNOSIS — H6592 Unspecified nonsuppurative otitis media, left ear: Secondary | ICD-10-CM | POA: Diagnosis not present

## 2021-04-12 DIAGNOSIS — H9212 Otorrhea, left ear: Secondary | ICD-10-CM | POA: Diagnosis not present

## 2021-05-10 DIAGNOSIS — H6592 Unspecified nonsuppurative otitis media, left ear: Secondary | ICD-10-CM | POA: Diagnosis not present

## 2021-05-10 DIAGNOSIS — H9212 Otorrhea, left ear: Secondary | ICD-10-CM | POA: Diagnosis not present

## 2021-05-19 ENCOUNTER — Other Ambulatory Visit: Payer: Self-pay | Admitting: Gastroenterology

## 2021-05-19 DIAGNOSIS — K219 Gastro-esophageal reflux disease without esophagitis: Secondary | ICD-10-CM

## 2021-06-13 ENCOUNTER — Encounter: Payer: Self-pay | Admitting: Family Medicine

## 2021-06-13 DIAGNOSIS — Z880 Allergy status to penicillin: Secondary | ICD-10-CM | POA: Diagnosis not present

## 2021-06-13 DIAGNOSIS — H532 Diplopia: Secondary | ICD-10-CM | POA: Diagnosis not present

## 2021-06-13 DIAGNOSIS — Z888 Allergy status to other drugs, medicaments and biological substances status: Secondary | ICD-10-CM | POA: Diagnosis not present

## 2021-06-13 DIAGNOSIS — Z79899 Other long term (current) drug therapy: Secondary | ICD-10-CM | POA: Diagnosis not present

## 2021-06-13 DIAGNOSIS — Z87891 Personal history of nicotine dependence: Secondary | ICD-10-CM | POA: Diagnosis not present

## 2021-06-13 DIAGNOSIS — Z9109 Other allergy status, other than to drugs and biological substances: Secondary | ICD-10-CM | POA: Diagnosis not present

## 2021-06-13 DIAGNOSIS — R519 Headache, unspecified: Secondary | ICD-10-CM | POA: Diagnosis not present

## 2021-06-13 DIAGNOSIS — R42 Dizziness and giddiness: Secondary | ICD-10-CM | POA: Diagnosis not present

## 2021-06-13 DIAGNOSIS — R110 Nausea: Secondary | ICD-10-CM | POA: Diagnosis not present

## 2021-06-13 DIAGNOSIS — Z887 Allergy status to serum and vaccine status: Secondary | ICD-10-CM | POA: Diagnosis not present

## 2021-06-13 DIAGNOSIS — R030 Elevated blood-pressure reading, without diagnosis of hypertension: Secondary | ICD-10-CM | POA: Diagnosis not present

## 2021-06-14 DIAGNOSIS — R42 Dizziness and giddiness: Secondary | ICD-10-CM | POA: Diagnosis not present

## 2021-06-20 ENCOUNTER — Encounter: Payer: Self-pay | Admitting: Family Medicine

## 2021-06-20 ENCOUNTER — Ambulatory Visit: Payer: BC Managed Care – PPO | Admitting: Family Medicine

## 2021-06-20 VITALS — BP 144/78 | HR 90 | Temp 98.9°F | Ht 72.75 in | Wt 205.1 lb

## 2021-06-20 DIAGNOSIS — R42 Dizziness and giddiness: Secondary | ICD-10-CM

## 2021-06-20 DIAGNOSIS — R5383 Other fatigue: Secondary | ICD-10-CM

## 2021-06-20 DIAGNOSIS — Z13220 Encounter for screening for lipoid disorders: Secondary | ICD-10-CM

## 2021-06-20 DIAGNOSIS — R2689 Other abnormalities of gait and mobility: Secondary | ICD-10-CM

## 2021-06-20 LAB — LIPID PANEL
Cholesterol: 166 mg/dL (ref 0–200)
HDL: 38.9 mg/dL — ABNORMAL LOW (ref >39.00)
LDL Cholesterol: 106 mg/dL — ABNORMAL HIGH (ref 0–99)
NonHDL: 126.8
Total CHOL/HDL Ratio: 4
Triglycerides: 105 mg/dL (ref 0.0–149.0)
VLDL: 21 mg/dL (ref 0.0–40.0)

## 2021-06-20 LAB — TSH: TSH: 1.09 u[IU]/mL (ref 0.35–5.50)

## 2021-06-20 LAB — VITAMIN D 25 HYDROXY (VIT D DEFICIENCY, FRACTURES): VITD: 29.73 ng/mL — ABNORMAL LOW (ref 30.00–100.00)

## 2021-06-20 NOTE — Patient Instructions (Signed)
I will check in with you to see if you heard from neurology once I get bloodwork results.   You will be called to set up MRI. Usually I have the results within 48 hours of exam and I will call you with these.   I will let you know once I complete your paperwork for work as well!

## 2021-06-20 NOTE — Progress Notes (Signed)
Adrick Kestler DOB: 1965-12-28 Encounter date: 06/20/2021  This is a 56 y.o. male who presents with Chief Complaint  Patient presents with   Follow-up    History of present illness: Patient presented to the emergency room on 06/13/2021 with headache and dizziness for 2 to 3 days.  He had stopped at urgent care prior to that but was sent to the ER by urgent care.  He also reported some blurring in vision and spots.  CT angio head and neck was within normal limits.  CT head showed no acute abnormality.  Obtaining MRI was discussed, but due to claustrophobia patient elected to forego this.  Neurology follow-up was also discussed.  Always has neck pain. He was looking up on internet and wondering about "cervical vertigo". A couple of days before going to ER had bad neck pain; lasted 2-3 days. Just thought way he sleep.   Hasn't felt right since having covid and then having tube put in his ear. Had covid last fall. Had tube put in ear back in early 03/2021.   Does feel that hearing is diminished on left side. Right ear seems ok.   Was seeing chiropractor for neck regularly, but got too expensive. 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   Taking meclizine. Seems to help a little bit. 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   He is out until march 6th.   More off balance than dizzy.   Feels more tired. Feels like he can sleep all day. Feels short of breath with activity. Eating healthy. Feels rested after sleeping, but can easily go back to sleep. Does snore; not had sleep study.   Getting headaches now - slight headache. We did discuss light headedness at last visit last year. Had headache last night. Took ibuprofen. Still sensitive at back of head. Sensitive to light - feels like this is all the time. Vision feels a little blurry. Sometimes seeing spots - yellow spots; noted getting out of shower. Sometimes floaters.  Had eye exam at [facility] right before christmas - everything was ok then. Did get rx for glasses - just wears for computer, watching tv.    Allergies  Allergen Reactions   Cayenne Itching   Methyldibromoglutaronitrile Other (See Comments)    Positive patch test Positive patch test Positive patch test Positive patch test    Nickel Other (See Comments)    Positive patch test Positive patch test Positive patch test Positive patch test    Penicillins Other (See Comments)    Not known Not known    Polyethylene Glycol    Propylene Glycol Other (See Comments) and Rash    Positive patch test H/o positive patch test Positive patch test    Shellac Other (See Comments)    Positive patch test Positive patch test Positive patch test Positive patch test    Tetanus Toxoids Other (See Comments)    Not known Not known    Other Other (See Comments)    Paraphenylenediamine- Positive patch test Paraphenylenediamine- Positive patch test    Tetanus Toxoid    Current Meds  Medication Sig   alfuzosin (UROXATRAL) 10 MG 24 hr tablet Take 10 mg by mouth daily.   desonide (DESOWEN) 0.05 % cream Apply to affected areas twice a day until smooth. For face, nipples, genitals.   fexofenadine (ALLEGRA) 180 MG tablet Take by mouth daily.   meclizine (ANTIVERT) 25 MG tablet Take  1 tablet (25 mg total) by mouth 3 (three) times daily as needed for dizziness.   meloxicam (MOBIC) 15 MG tablet Take 1 tablet (15 mg total) by mouth daily. Take with food-  Take for the next 2-3 weeks then may wean off.   Multiple Vitamin (MULTIVITAMIN) capsule Take 1 capsule by mouth daily.   omeprazole (PRILOSEC) 40 MG capsule Take 1 capsule (40 mg total) by mouth daily.   TADALAFIL PO Take 5 mg by mouth as needed.   triamcinolone (KENALOG) 0.1 % Apply 1 application topically 2 (two) times daily. Apply to back x 14 days    Review of Systems  Constitutional:  Positive for fatigue. Negative for chills and fever.   Respiratory:  Negative for cough, chest tightness, shortness of breath (with more discussion states not short of breath. just feels fatigued with activity) and wheezing.   Cardiovascular:  Negative for chest pain, palpitations and leg swelling.  Neurological:  Positive for dizziness. Negative for weakness.   Objective:  BP (!) 144/78 (BP Location: Right Arm, Patient Position: Sitting, Cuff Size: Large)    Pulse 90    Temp 98.9 F (37.2 C) (Oral)    Ht 6' 0.75" (1.848 m)    Wt 205 lb 1.6 oz (93 kg)    SpO2 96%    BMI 27.25 kg/m   Weight: 205 lb 1.6 oz (93 kg)   BP Readings from Last 3 Encounters:  06/20/21 (!) 144/78  11/19/20 130/62  07/27/20 140/84   Wt Readings from Last 3 Encounters:  06/20/21 205 lb 1.6 oz (93 kg)  11/19/20 199 lb 3.2 oz (90.4 kg)  07/27/20 201 lb 6.4 oz (91.4 kg)    Physical Exam Constitutional:      General: He is not in acute distress.    Appearance: He is well-developed. He is not diaphoretic.  HENT:     Head: Normocephalic and atraumatic.     Right Ear: External ear normal.     Left Ear: External ear normal.  Eyes:     General: Lids are normal.     Conjunctiva/sclera: Conjunctivae normal.     Pupils: Pupils are equal, round, and reactive to light.     Comments: No nystagmus noted although he does have dizziness with changes in position lying to sitting, sitting to lying. Mild with sitting to standing. NO orthostasis (bp stable with position change)  Neck:     Thyroid: No thyromegaly.  Cardiovascular:     Rate and Rhythm: Normal rate and regular rhythm.     Heart sounds: Normal heart sounds. No murmur heard.   No friction rub. No gallop.  Pulmonary:     Effort: Pulmonary effort is normal. No respiratory distress.     Breath sounds: Normal breath sounds. No wheezing or rales.  Musculoskeletal:     Cervical back: Neck supple.  Lymphadenopathy:     Cervical: No cervical adenopathy.  Skin:    General: Skin is warm and dry.  Neurological:      Mental Status: He is alert and oriented to person, place, and time.     Cranial Nerves: No cranial nerve deficit.     Motor: No weakness, tremor, atrophy, abnormal muscle tone or pronator drift.     Coordination: Romberg sign positive. Coordination normal. Heel to [facility] Test abnormal (difficulty with balance on foot). Finger-Nose-Finger Test normal. Rapid alternating movements normal.     Gait: Tandem walk abnormal (falling to left). Gait normal.     Deep Tendon  Reflexes: Reflexes normal.     Reflex Scores:      Tricep reflexes are 2+ on the right side and 2+ on the left side.      Bicep reflexes are 2+ on the right side and 2+ on the left side.      Brachioradialis reflexes are 2+ on the right side and 2+ on the left side.      Patellar reflexes are 3+ on the right side and 3+ on the left side. Psychiatric:        Behavior: Behavior normal.    Assessment/Plan 1. Dizziness Neurology was consulted when patient in ER and recommended MRI to r/o stroke. He is willing to complete this now and I think this is important piece of eval. If negative, I do think that focusing on inner ear/bppv with therapy could be helpful with follow up with ENT.  - MR Brain Wo Contrast; Future  2. Abnormality of gait due to impairment of balance See above.  - MR Brain Wo Contrast; Future  3. Other fatigue We will do a couple of additional labs. Cbc, cmp, mg grossly normal from ER.  - TSH; Future - VITAMIN D 25 Hydroxy (Vit-D Deficiency, Fractures); Future - VITAMIN D 25 Hydroxy (Vit-D Deficiency, Fractures) - TSH  4. Vertigo See above.  6. Lipid screening - Lipid panel; Future - Lipid panel    Return for pending lab or imaging results. 45 minutes spent in chart review, time with patient, discussion of follow up. I will complete FMLA paperwork for him with anticipated RTW date of 3/6, although this could change pending eval. Work requires lifting, working with machines, sometimes heavy/repetitive  motion,but not climbing or significant change in position.    Micheline Rough, MD

## 2021-06-22 ENCOUNTER — Encounter: Payer: Self-pay | Admitting: Family Medicine

## 2021-06-22 DIAGNOSIS — R519 Headache, unspecified: Secondary | ICD-10-CM

## 2021-06-22 DIAGNOSIS — R42 Dizziness and giddiness: Secondary | ICD-10-CM

## 2021-06-22 DIAGNOSIS — R2689 Other abnormalities of gait and mobility: Secondary | ICD-10-CM

## 2021-06-23 ENCOUNTER — Telehealth: Payer: Self-pay | Admitting: Family Medicine

## 2021-06-23 NOTE — Telephone Encounter (Signed)
Patient called in to request a referral for a neurologist.  Patient sent in a message via [facility] about this same matter.  Please advise.

## 2021-06-24 ENCOUNTER — Encounter: Payer: Self-pay | Admitting: Family Medicine

## 2021-06-24 NOTE — Telephone Encounter (Signed)
I don't mind putting in referral.he wants Dr. Billey Gosling who is listed on [facility] site as being with [facility] (although not listed on [facility]). My only concern is that i'm not sure if Dr. Billey Gosling also manages dizziness? She is neurology, but it looks like her focus is headaches. Can you verify with them before placing referral? Just hate for him to wait for referral/seeing provider and then not be able to get seen for his concern. He has had some headache as well, but balance is issue.

## 2021-06-24 NOTE — Telephone Encounter (Signed)
Spoke with Lattie Haw at [facility] [phone]), she stated Dr Billey Gosling is only listed as treating headaches and the office is closed today.  Message sent to PCP.

## 2021-06-24 NOTE — Telephone Encounter (Signed)
Please let patient know. That was my concern. Not sure if he got in contact with anyone else? May be quicker to use neuro outside of our system - like [facility] or [facility]? He could call and see what wait time is and let us know preference?

## 2021-06-25 NOTE — Addendum Note (Signed)
Addended by: Caren Macadam on: 06/25/2021 06:41 PM   Modules accepted: Orders

## 2021-06-27 ENCOUNTER — Encounter: Payer: Self-pay | Admitting: Family Medicine

## 2021-06-27 ENCOUNTER — Telehealth: Payer: Self-pay | Admitting: Family Medicine

## 2021-06-27 DIAGNOSIS — R42 Dizziness and giddiness: Secondary | ICD-10-CM

## 2021-06-27 NOTE — Telephone Encounter (Signed)
Pt is calling and [facility] can not get him an appt until 07-04-2020 for brain MRI. Pt is needs open MRI due to claustrophobic and the one on [address]6 is not open MRI and Pt would like to be sch closer to him in [place] . Please advise

## 2021-06-27 NOTE — Telephone Encounter (Signed)
Pt is calling and  would like to go to [facility] [address] in [place] phone [phone] and they do have open MRI

## 2021-06-27 NOTE — Telephone Encounter (Signed)
See Mychart message from PCP to patient dated 2/22.

## 2021-06-29 ENCOUNTER — Encounter: Payer: Self-pay | Admitting: Family Medicine

## 2021-06-29 MED ORDER — DIAZEPAM 5 MG PO TABS: 5.0000 mg | ORAL_TABLET | Freq: Two times a day (BID) | ORAL | 0 refills | Status: DC | PRN

## 2021-06-29 NOTE — Telephone Encounter (Signed)
Ok to send order as requested ?

## 2021-06-29 NOTE — Telephone Encounter (Signed)
Spoke with the patient and informed him the order was entered as below.  ?

## 2021-07-04 ENCOUNTER — Encounter: Payer: BC Managed Care – PPO | Admitting: Family Medicine

## 2021-07-05 ENCOUNTER — Encounter: Payer: Self-pay | Admitting: Family Medicine

## 2021-07-05 NOTE — Telephone Encounter (Signed)
Spoke with the patient and he said he may need another week out of work as he is awaiting the MRI and neurology appts.  Patient stated he will contact his disability company and just wanted PCP to know she may receive additional paperwork from the [employer]. ?

## 2021-07-05 NOTE — Telephone Encounter (Signed)
Pt MRI location has been changed with pt ins. The order was faxed to [facility] for the open MRI. Pt was also given the number to call and sch if he does not hear from them. ? ?Pt would like a call to speak about his leave with his job. Please call pt at [phone]. ? ?Thank you! ?

## 2021-07-06 ENCOUNTER — Encounter: Payer: Self-pay | Admitting: Family Medicine

## 2021-07-06 NOTE — Telephone Encounter (Signed)
Noted  

## 2021-07-08 ENCOUNTER — Other Ambulatory Visit: Payer: Self-pay | Admitting: Family Medicine

## 2021-07-08 DIAGNOSIS — R42 Dizziness and giddiness: Secondary | ICD-10-CM

## 2021-07-08 DIAGNOSIS — H9209 Otalgia, unspecified ear: Secondary | ICD-10-CM

## 2021-07-09 DIAGNOSIS — R42 Dizziness and giddiness: Secondary | ICD-10-CM | POA: Diagnosis not present

## 2021-07-11 ENCOUNTER — Encounter: Payer: Self-pay | Admitting: Family Medicine

## 2021-07-13 ENCOUNTER — Encounter: Payer: Self-pay | Admitting: Family Medicine

## 2021-07-15 ENCOUNTER — Encounter: Payer: Self-pay | Admitting: Family Medicine

## 2021-07-15 ENCOUNTER — Ambulatory Visit: Payer: BC Managed Care – PPO | Admitting: Family Medicine

## 2021-07-15 VITALS — BP 98/60 | HR 87 | Temp 99.2°F | Ht 73.0 in | Wt 200.6 lb

## 2021-07-15 DIAGNOSIS — H9202 Otalgia, left ear: Secondary | ICD-10-CM | POA: Diagnosis not present

## 2021-07-15 DIAGNOSIS — Z0001 Encounter for general adult medical examination with abnormal findings: Secondary | ICD-10-CM

## 2021-07-15 DIAGNOSIS — R519 Headache, unspecified: Secondary | ICD-10-CM | POA: Diagnosis not present

## 2021-07-15 DIAGNOSIS — Z0000 Encounter for general adult medical examination without abnormal findings: Secondary | ICD-10-CM

## 2021-07-15 DIAGNOSIS — E785 Hyperlipidemia, unspecified: Secondary | ICD-10-CM | POA: Diagnosis not present

## 2021-07-15 DIAGNOSIS — R42 Dizziness and giddiness: Secondary | ICD-10-CM | POA: Diagnosis not present

## 2021-07-15 NOTE — Progress Notes (Signed)
?Edward Nelson ?DOB: 1965/09/11 ?Encounter date: 07/15/2021 ? ?This is a 56 y.o. male who presents with ?Chief Complaint  ?Patient presents with  ? Annual Exam  ? ? ?History of present illness: ?Patient here for annual exam, but also needing paperwork completed due to being out of work.  ? ?*last physical 06/24/20 - patient reported failing hearing eval at work.  ? ?Fasted today for physical. Does have some headache, dizziness. ? ?Seeing ENT on Tuesday. Will talk with them about sx and they are planning some vestibular testing.  ? ?He plans to be out until 3/27 with return 3/28. But he would like to have sx evaluated by ENT and discuss plan prior to going back. Sx have not resolved at this time, but he would like to have specialist evaluation.  ? ?Still getting dizzy spells intermittently.  ? ?His neck is also hurting him quite a bit. Hx of fusion ? ?Dizzy spell at work, then headache started after laying down at home. Ear pain started weeks later. Had clogging with ear after having covid, but didn't have pain with that.  ? ?Just feels off balance all the time. Worse at times, changes with activity. Not bad rolling over in bed. Increase with standing quick.  ? ?Eyes seem ok.  ? ?Repeat colonoscopy due 08/2022 ? ?Walks occasionally.  ? ?Allergies  ?Allergen Reactions  ? Cayenne Itching  ? Methyldibromoglutaronitrile Other (See Comments)  ?  Positive patch test ?Positive patch test ?Positive patch test ?Positive patch test ?  ? Nickel Other (See Comments)  ?  Positive patch test ?Positive patch test ?Positive patch test ?Positive patch test ?  ? Penicillins Other (See Comments)  ?  Not known ?Not known ?  ? Polyethylene Glycol   ? Propylene Glycol Other (See Comments) and Rash  ?  Positive patch test ?H/o positive patch test ?Positive patch test ?  ? Shellac Other (See Comments)  ?  Positive patch test ?Positive patch test ?Positive patch test ?Positive patch test ?  ? Tetanus Toxoids Other (See Comments)  ?  Not  known ?Not known ?  ? Other Other (See Comments)  ?  Paraphenylenediamine- Positive patch test ?Paraphenylenediamine- Positive patch test ?  ? Tetanus Toxoid   ? ?Current Meds  ?Medication Sig  ? alfuzosin (UROXATRAL) 10 MG 24 hr tablet Take 10 mg by mouth daily.  ? desonide (DESOWEN) 0.05 % cream Apply to affected areas twice a day until smooth. For face, nipples, genitals.  ? diazepam (VALIUM) 5 MG tablet Take 1 tablet (5 mg total) by mouth every 12 (twelve) hours as needed for anxiety. Take 1 tablet PO 1 hour prior to procedure. Repeat dose x1 in 30 minutes if needed for anxiety.  ? fexofenadine (ALLEGRA) 180 MG tablet Take by mouth daily.  ? meclizine (ANTIVERT) 25 MG tablet Take 1 tablet (25 mg total) by mouth 3 (three) times daily as needed for dizziness.  ? meloxicam (MOBIC) 15 MG tablet Take 1 tablet (15 mg total) by mouth daily. Take with food-  Take for the next 2-3 weeks then may wean off.  ? Multiple Vitamin (MULTIVITAMIN) capsule Take 1 capsule by mouth daily.  ? omeprazole (PRILOSEC) 40 MG capsule Take 1 capsule (40 mg total) by mouth daily.  ? TADALAFIL PO Take 5 mg by mouth as needed.  ? triamcinolone (KENALOG) 0.1 % Apply 1 application topically 2 (two) times daily. Apply to back x 14 days  ? ? ?Review of Systems  ?Constitutional:  Negative for activity change, appetite change, chills, fatigue, fever and unexpected weight change.  ?HENT:  Negative for congestion, ear pain, hearing loss, sinus pressure, sinus pain, sore throat and trouble swallowing.   ?Eyes:  Negative for pain and visual disturbance.  ?Respiratory:  Negative for cough, chest tightness, shortness of breath and wheezing.   ?Cardiovascular:  Negative for chest pain, palpitations and leg swelling.  ?Gastrointestinal:  Negative for abdominal distention, abdominal pain, blood in stool, constipation, diarrhea, nausea and vomiting.  ?Genitourinary:  Negative for decreased urine volume, difficulty urinating, dysuria, penile pain and  testicular pain.  ?Musculoskeletal:  Negative for arthralgias, back pain and joint swelling.  ?Skin:  Negative for rash.  ?Neurological:  Negative for dizziness, weakness, numbness and headaches.  ?Hematological:  Negative for adenopathy. Does not bruise/bleed easily.  ?Psychiatric/Behavioral:  Negative for agitation, sleep disturbance and suicidal ideas. The patient is not nervous/anxious.   ? ?Objective: ? ?BP 98/60 (BP Location: Left Arm, Patient Position: Sitting, Cuff Size: Normal)   Pulse 87   Temp 99.2 ?F (37.3 ?C) (Oral)   Ht '6\' 1"'$  (1.854 m)   Wt 200 lb 9.6 oz (91 kg)   SpO2 98%   BMI 26.47 kg/m?   Weight: 200 lb 9.6 oz (91 kg)  ? ?BP Readings from Last 3 Encounters:  ?07/15/21 98/60  ?06/20/21 (!) 144/78  ?11/19/20 130/62  ? ?Wt Readings from Last 3 Encounters:  ?07/15/21 200 lb 9.6 oz (91 kg)  ?06/20/21 205 lb 1.6 oz (93 kg)  ?11/19/20 199 lb 3.2 oz (90.4 kg)  ? ? ?Physical Exam ?Constitutional:   ?   General: He is not in acute distress. ?   Appearance: He is well-developed.  ?HENT:  ?   Head: Normocephalic and atraumatic.  ?   Right Ear: Hearing, tympanic membrane, ear canal and external ear normal.  ?   Left Ear: Tympanic membrane, ear canal and external ear normal.  ?   Ears:  ?   Comments: Left Tm tube intact ?   Nose: Nose normal.  ?   Mouth/Throat:  ?   Pharynx: No oropharyngeal exudate.  ?Eyes:  ?   Conjunctiva/sclera: Conjunctivae normal.  ?   Pupils: Pupils are equal, round, and reactive to light.  ?Neck:  ?   Thyroid: No thyromegaly.  ?Cardiovascular:  ?   Rate and Rhythm: Normal rate and regular rhythm.  ?   Heart sounds: Normal heart sounds. No murmur heard. ?  No friction rub. No gallop.  ?Pulmonary:  ?   Effort: Pulmonary effort is normal. No respiratory distress.  ?   Breath sounds: Normal breath sounds. No stridor. No wheezing or rales.  ?Abdominal:  ?   General: Bowel sounds are normal.  ?   Palpations: Abdomen is soft.  ?Genitourinary: ?   Comments: Gets cysts occasional on penis.  Has seen derm in past for this. Currently 0.25 cm cyst, no drainage mild tenderness. Typically resolve on their own. ?Musculoskeletal:     ?   General: Normal range of motion.  ?   Cervical back: Neck supple.  ?Skin: ?   General: Skin is warm and dry.  ?Neurological:  ?   Mental Status: He is alert and oriented to person, place, and time.  ?   Motor: Motor function is intact.  ?   Coordination: Romberg sign positive. Heel to [facility] Test abnormal.  ?   Gait: Tandem walk abnormal. Gait normal.  ?   Deep Tendon Reflexes: Reflexes are normal  and symmetric.  ?Psychiatric:     ?   Behavior: Behavior normal.     ?   Thought Content: Thought content normal.     ?   Judgment: Judgment normal.  ? ? ?Assessment/Plan ? ?1. Preventative health care ?Encouraged him to keep up with regular exercise, walking. ? ?2. Hyperlipidemia, unspecified hyperlipidemia type ?Diet controlled. ? ?3. Dizziness ?He has follow-up with ENT on Tuesday.  He has appointment with neurology coming up next month.  MRI of brain was stable without acute changes.  He is concerned about small bilateral mastoid effusions, but I will have him discuss this with ENT.  There was also noted minimal mucosal thickening in paranasal sinuses.  I feel that it is unlikely that these findings are contributing to ongoing symptoms.  No ear pain today. ? ?He has been out of work due to dizziness since his ER visit(prior to last appointment). He is not better at this point with dizziness, but he wants to return to work. He plans to discuss options with ENT at upcoming appointment. He does not feel that work can offer different type of work for him that would not involve lifting, bending, head turning. He is not certain how he will feel when being back at work as symptoms are regularly present but do also flare at times. ? ?4. Nonintractable headache, unspecified chronicity pattern, unsp next colonoscopy is due 08/2022.  Ecified headache type ?Has follow up with neurology. May be  related to ongoing dizziness as well as neck pain. Consider further eval pending ENT/neuro eval. Has had Ct head, CTA head and neck, and MRI that were stable. ? ?5. Left ear pain ?Ongoing since post covid/ear pain/

## 2021-07-18 ENCOUNTER — Telehealth: Payer: Self-pay | Admitting: Family Medicine

## 2021-07-18 ENCOUNTER — Telehealth: Payer: Self-pay | Admitting: *Deleted

## 2021-07-18 ENCOUNTER — Encounter: Payer: Self-pay | Admitting: Family Medicine

## 2021-07-18 NOTE — Telephone Encounter (Signed)
Pt call and stated he want dr.Koberlein to ptease fill out the forms for [employer] and the [employer] and send them off today so he can get paid. ?

## 2021-07-18 NOTE — Telephone Encounter (Signed)
-----   Message from Garnet Sierras, DO sent at 07/17/2021 10:31 PM EDT ----- ?Please call patient and let him know that I received a message from his PCP regarding what lotion that he should be using. ? ?I haven't seen him in over 1 year and he did not have any complaints of lotion. ? ?Please have him schedule a televisit or in office visit regarding this. ?Thank you. ? ?----- Message ----- ?From: Caren Macadam, MD ?Sent: 07/15/2021   1:39 PM EDT ?To: Garnet Sierras, DO ? ?Could you make a recommendation for lotion for this patient? It seems all of them have petroleum and with his polyethylene glocol allergy, he has had a hard time in the past. Appreciate your consideration. ? ?

## 2021-07-18 NOTE — Telephone Encounter (Signed)
Patient has been scheduled to come in and see Dr. Maudie Mercury in [facility]  ?

## 2021-07-19 ENCOUNTER — Encounter: Payer: Self-pay | Admitting: Family Medicine

## 2021-07-19 DIAGNOSIS — R42 Dizziness and giddiness: Secondary | ICD-10-CM | POA: Diagnosis not present

## 2021-07-19 NOTE — Telephone Encounter (Signed)
See My chart message

## 2021-07-20 DIAGNOSIS — R42 Dizziness and giddiness: Secondary | ICD-10-CM | POA: Diagnosis not present

## 2021-07-24 ENCOUNTER — Encounter: Payer: Self-pay | Admitting: Family Medicine

## 2021-07-25 ENCOUNTER — Encounter: Payer: Self-pay | Admitting: Family Medicine

## 2021-07-26 ENCOUNTER — Ambulatory Visit: Payer: BC Managed Care – PPO | Admitting: Allergy

## 2021-07-27 ENCOUNTER — Encounter: Payer: Self-pay | Admitting: Family Medicine

## 2021-07-27 NOTE — Telephone Encounter (Signed)
Paperwork faxed to [facility] at [phone] and sent to be scanned. ?

## 2021-07-28 ENCOUNTER — Ambulatory Visit: Payer: BC Managed Care – PPO | Admitting: Neurology

## 2021-07-28 ENCOUNTER — Encounter: Payer: Self-pay | Admitting: Neurology

## 2021-07-28 VITALS — BP 149/89 | HR 82 | Ht 73.0 in | Wt 201.0 lb

## 2021-07-28 DIAGNOSIS — M542 Cervicalgia: Secondary | ICD-10-CM | POA: Diagnosis not present

## 2021-07-28 DIAGNOSIS — R42 Dizziness and giddiness: Secondary | ICD-10-CM

## 2021-07-28 DIAGNOSIS — Z981 Arthrodesis status: Secondary | ICD-10-CM | POA: Diagnosis not present

## 2021-07-28 DIAGNOSIS — R519 Headache, unspecified: Secondary | ICD-10-CM | POA: Diagnosis not present

## 2021-07-28 NOTE — Progress Notes (Signed)
?[facility] ? ? ? ?Provider:  Dr Jaynee Eagles ?Requesting Provider: Caren Macadam, MD ?Primary Care Provider:  Caren Macadam, MD ? ?CC:  headaches, abnormality of gait ? ?HPI:  Edward Nelson is a 56 y.o. male here as requested by Caren Macadam, MD for abnormality of gait and headaches.  He has a past medical history of peripheral venous insufficiency, smoking, erectile dysfunction, anxiety. He states it started 06/12/2021 he came home from work and went to go get in bed and was really dizzy and almost fell, he layed down and when he got up he went to the ED with a headache, all was fine, he went to the ENT and everything was fine and he went to the PT and was diagnosed with cervicogenic dizziness and cervicalgia, headaches were in the back of the head. Tenderness to the back of the head (poits to the emergence of the occipital nerve). Feeling better with physical therapy. Headaches and dizziness are better and so is imbalance. Improving. He has a history of acdf. No arm weakness, leg weakness, radiculopathy, vision changes. No other focal neurologic deficits, associated symptoms, inciting events or modifiable factors. ? ?Reviewed notes, labs and imaging from outside physicians, which showed: ? ? ?Reviewed Dr. Berenice Bouton notes, patient presented to the emergency room in February 2023 with headache and dizziness, also some blurring of the vision and spots, CT angio head and neck, CT of the head unremarkable, he did eventually obtain an MRI of the brain which was unremarkable, he does have chronic neck pain. 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  At follow-up with primary care in March of this year he was still getting dizziness and headaches and chronic neck pain.  He was evaluated by ear nose and throat.  I reviewed Dr. Dimple Nanas notes ear nose and throat doctor: He saw them on July 19, 2021 and reported that dizziness was  better, still continued neck pain, and left ear pain.  Physical exam by ENT was normal and following was the plan of action:Vestibular therapy referral, Will arrange for VNG, Will start home vestibular exercises in the interim, No activity restrictions currently ?  ?TSH,cbc 05/2021 normal ? ? ?MRI brain 07/09/2021: Brain: There is no evidence of an acute infarct, intracranial  ?hemorrhage, mass, midline shift, or extra-axial fluid collection.  ?The ventricles and sulci are normal. A few punctate T2  ?hyperintensities in the cerebral white matter are within normal  ?limits for age. No focal cerebellar insult is evident.  ? ?Vascular: Major intracranial vascular flow voids are preserved.  ? ?Skull and upper cervical spine: Unremarkable bone marrow signal.  ? ?Sinuses/Orbits: Unremarkable orbits. Minimal mucosal thickening in  ?the paranasal sinuses. Small bilateral mastoid effusions.  ? ?Other: None.  ? ?IMPRESSION:  ?Unremarkable appearance of the brain for age.   ? ?06/14/2021: CTA H&N ? ?HISTORY:  56 years  old Male with Dizziness, non-specific.  ? ?FINDINGS: CTA HEAD: No proximal filling defect is seen within the middle, anterior, and posterior cerebral arteries.  ? ?The visualized intracranial vertebral arteries appear unremarkable.  ? ?The cerebral internal carotid arteries appear to be well opacified.  ? ?No obvious aneurysm or stenosis is readily apparent.  ? ?The basilar artery and visualized portions of the cerebellar arterial circulation appear unremarkable.  ? ?The dural venous vasculature demonstrates no focal filling defect.  ? ?CTA NECK:  ? ?There is normal three-vessel aortic arch morphology.  ? ?The brachiocephalic and proximal  subclavian arteries are patent and normal in course and caliber.  ?The common carotid arteries and proximal external carotid arteries are patent and normal in course and caliber.  ?The cervical segments of the internal carotid arteries and carotid bulbs are patent and normal in  course and caliber.  ?There is a codominant vertebral arterial system.  ?The vertebral arteries are patent and normal in course and caliber.  ? ?Review of Systems: ?Patient complains of symptoms per HPI as well as the following symptoms neck pain. Pertinent negatives and positives per HPI. All others negative. ? ? ?Social History  ? ?Socioeconomic History  ? Marital status: Single  ?  Spouse name: Not on file  ? Number of children: 0  ? Years of education: Not on file  ? Highest education level: Not on file  ?Occupational History  ? Occupation: Fabrication  ?Tobacco Use  ? Smoking status: Former  ?  Packs/day: 1.00  ?  Years: 35.00  ?  Pack years: 35.00  ?  Types: Cigarettes  ?  Start date: 41  ?  Quit date: 2015  ?  Years since quitting: 8.2  ? Smokeless tobacco: Never  ?Vaping Use  ? Vaping Use: Never used  ?Substance and Sexual Activity  ? Alcohol use: Yes  ?  Comment: occasionally 1 per day, not on a weekly basis  ? Drug use: Never  ? Sexual activity: Not Currently  ?  Partners: Female  ?Other Topics Concern  ? Not on file  ?Social History Narrative  ? Lives alone  ? Right handed  ? Caffeine: 1 cup coffee in AM, later in day 1 soda or tea  ? ?Social Determinants of Health  ? ?Financial Resource Strain: Unknown  ? Difficulty of Paying Living Expenses: Patient refused  ?Food Insecurity: Unknown  ? Worried About Charity fundraiser in the Last Year: Patient refused  ? Ran Out of Food in the Last Year: Patient refused  ?Transportation Needs: No Transportation Needs  ? Lack of Transportation (Medical): No  ? Lack of Transportation (Non-Medical): No  ?Physical Activity: Unknown  ? Days of Exercise per Week: 7 days  ? Minutes of Exercise per Session: Patient refused  ?Stress: No Stress Concern Present  ? Feeling of Stress : Not at all  ?Social Connections: Unknown  ? Frequency of Communication with Friends and Family: More than three times a week  ? Frequency of Social Gatherings with Friends and Family: Twice a week   ? Attends Religious Services: Patient refused  ? Active Member of Clubs or Organizations: No  ? Attends Archivist Meetings: Not on file  ? Marital Status: Never married  ?Intimate Partner Violence: Not on file  ? ? ?Family History  ?Problem Relation Age of Onset  ? Breast cancer Mother   ? Osteoporosis Mother   ? Hypertension Mother   ? Arthritis Mother   ? Cancer - Other Father   ?     oral  ? Breast cancer Sister   ? Hypertension Sister   ? Other Brother   ?     acid reflux  ? Colon cancer Neg Hx   ? Rectal cancer Neg Hx   ? Stomach cancer Neg Hx   ? Esophageal cancer Neg Hx   ? ? ?Past Medical History:  ?Diagnosis Date  ? Enlarged prostate   ? treated by urologist with [facility]  ? GERD (gastroesophageal reflux disease)   ? ? ?Patient Active Problem List  ?  Diagnosis Date Noted  ? Dizziness 07/28/2021  ? Hx of fusion of cervical spine 07/28/2021  ? Cervicalgia 07/28/2021  ? Peripheral venous insufficiency 07/30/2020  ? Varicose veins of lower extremity 07/30/2020  ? Constipation due to outlet dysfunction   ? Dyssynergic defecation   ? Sebaceous cyst 01/20/2020  ? Adverse effect of other viral vaccines, subsequent encounter 10/30/2019  ? Heartburn 06/16/2019  ? Pruritic rash 06/16/2019  ? History of adverse drug reaction 06/16/2019  ? History of smoking 06/16/2019  ? Loose body in knee, left knee 04/26/2018  ? Chronic prostatitis 08/20/2015  ? Erectile dysfunction 08/20/2015  ? Other allergic rhinitis 07/20/2009  ? Hemorrhoids 08/30/2007  ? GERD 08/30/2007  ? HIATAL HERNIA 08/30/2007  ? Anxiety state 08/15/2007  ? Dermatophytosis 07/22/2007  ? ? ?Past Surgical History:  ?Procedure Laterality Date  ? ANAL RECTAL MANOMETRY N/A 02/04/2020  ? Procedure: ANO RECTAL MANOMETRY;  Surgeon: Thornton Park, MD;  Location: [facility];  Service: Gastroenterology;  Laterality: N/A;  ? BLADDER SURGERY  2010  ? Transurethral incision of bladder neck  ? [place] SURGERY  2013  ? CYSTOSCOPY WITH INSERTION  OF UROLIFT    ? KNEE ARTHROSCOPY Left   ? NASAL SINUS SURGERY Left   ? started bleeding so didn't complete right side  ? right leg fracture  1986  ? tibial/fib fracture with surgical correction, plate  ? ? ?

## 2021-07-28 NOTE — Patient Instructions (Addendum)
?  Cervicogenic dizziness ?Component of cervicalgia and occipital neuralgia from nerve coming out of the neck ?Discussed adjacent segment level disease after ACDF, if symptoms worsen recommend repeating MRI cervical spine ? ?Occiital neuralgia ? ? ? ?

## 2021-07-29 ENCOUNTER — Encounter: Payer: Self-pay | Admitting: Family Medicine

## 2021-07-29 DIAGNOSIS — R42 Dizziness and giddiness: Secondary | ICD-10-CM | POA: Diagnosis not present

## 2021-08-02 DIAGNOSIS — R42 Dizziness and giddiness: Secondary | ICD-10-CM | POA: Diagnosis not present

## 2021-08-05 DIAGNOSIS — R42 Dizziness and giddiness: Secondary | ICD-10-CM | POA: Diagnosis not present

## 2021-08-12 DIAGNOSIS — R42 Dizziness and giddiness: Secondary | ICD-10-CM | POA: Diagnosis not present

## 2021-08-15 DIAGNOSIS — R42 Dizziness and giddiness: Secondary | ICD-10-CM | POA: Diagnosis not present

## 2021-08-16 ENCOUNTER — Ambulatory Visit: Payer: BC Managed Care – PPO | Admitting: Neurology

## 2021-08-17 DIAGNOSIS — R42 Dizziness and giddiness: Secondary | ICD-10-CM | POA: Diagnosis not present

## 2021-11-10 ENCOUNTER — Other Ambulatory Visit: Payer: Self-pay | Admitting: Gastroenterology

## 2021-11-10 DIAGNOSIS — K219 Gastro-esophageal reflux disease without esophagitis: Secondary | ICD-10-CM

## 2022-02-22 IMAGING — DX DG FOOT COMPLETE 3+V*L*
3 series · 3 of 3 positions shown · non-contrast
Comparison: None

CLINICAL DATA: Pain and edema

EXAM:
LEFT FOOT - COMPLETE 3+ VIEW

[foot ap]
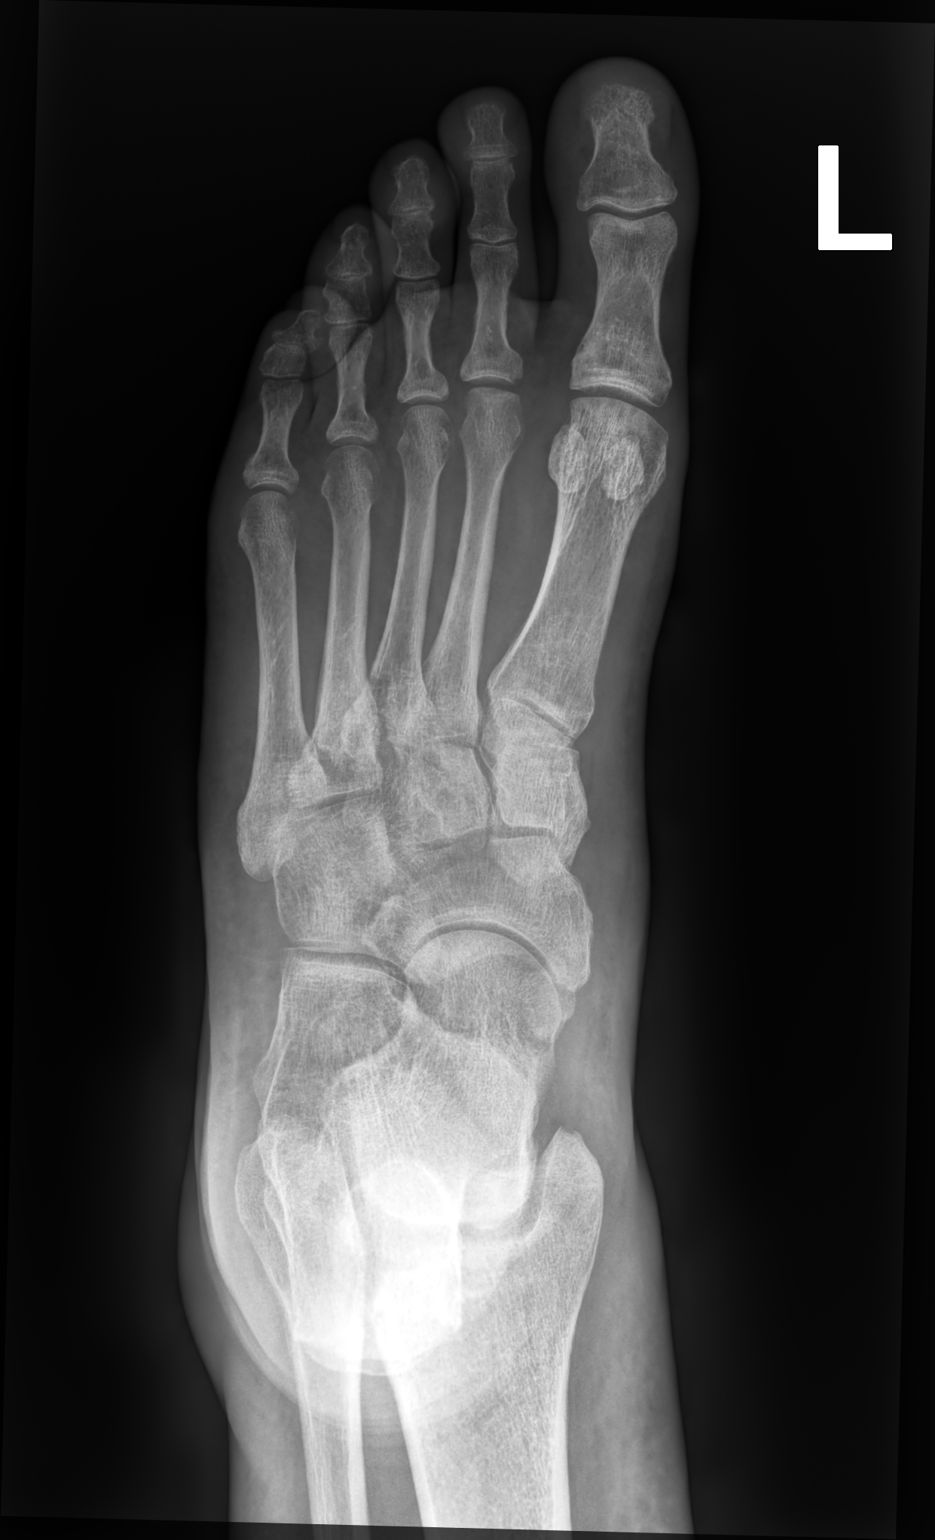

[foot mlo]
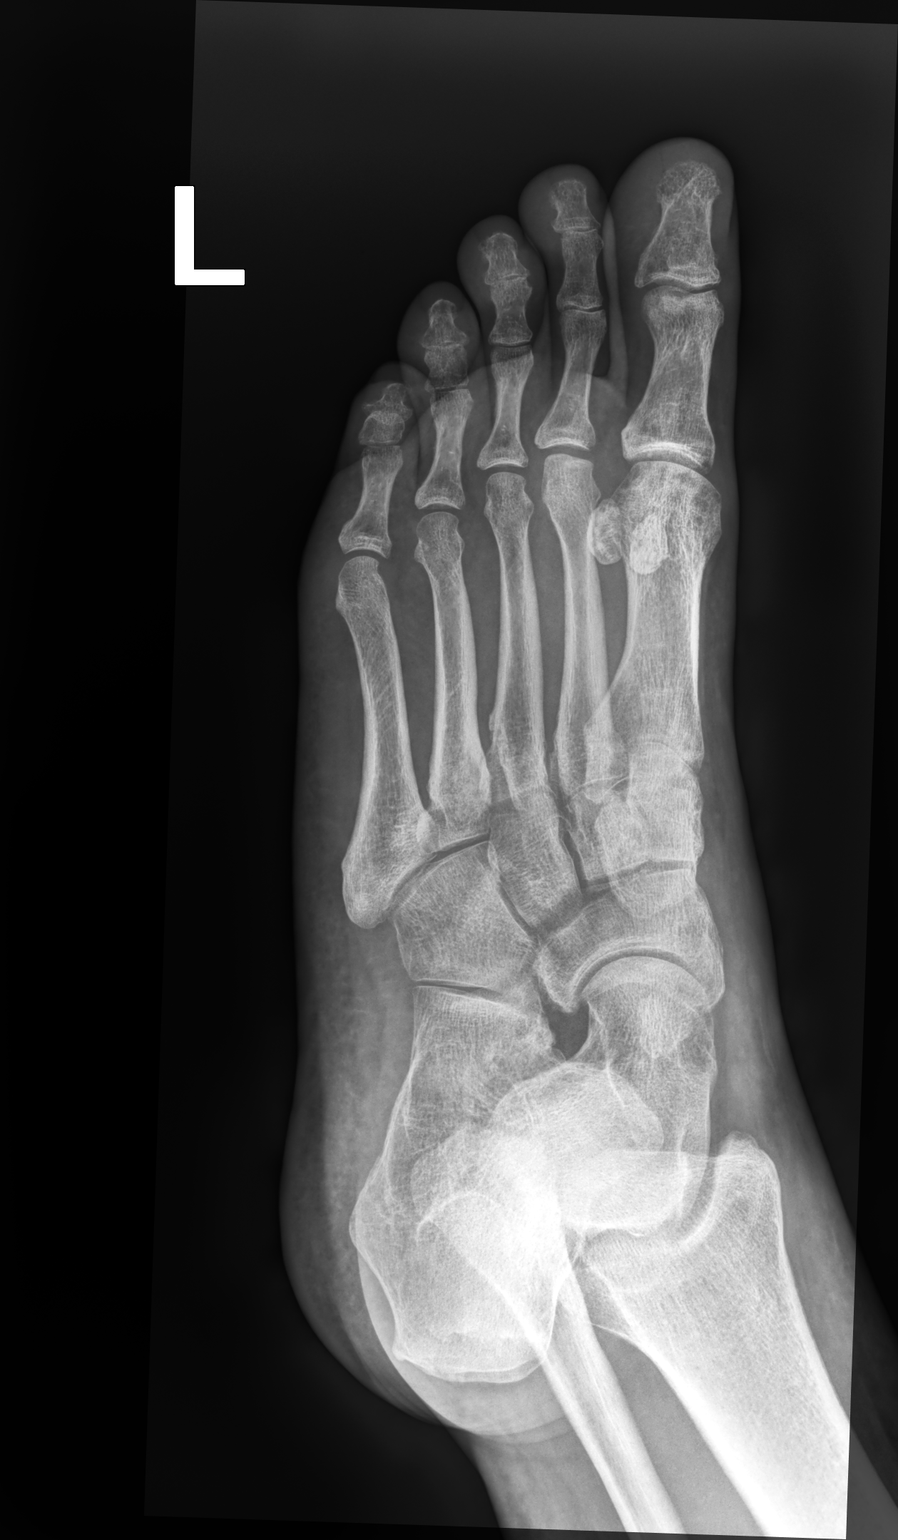

[foot lat]
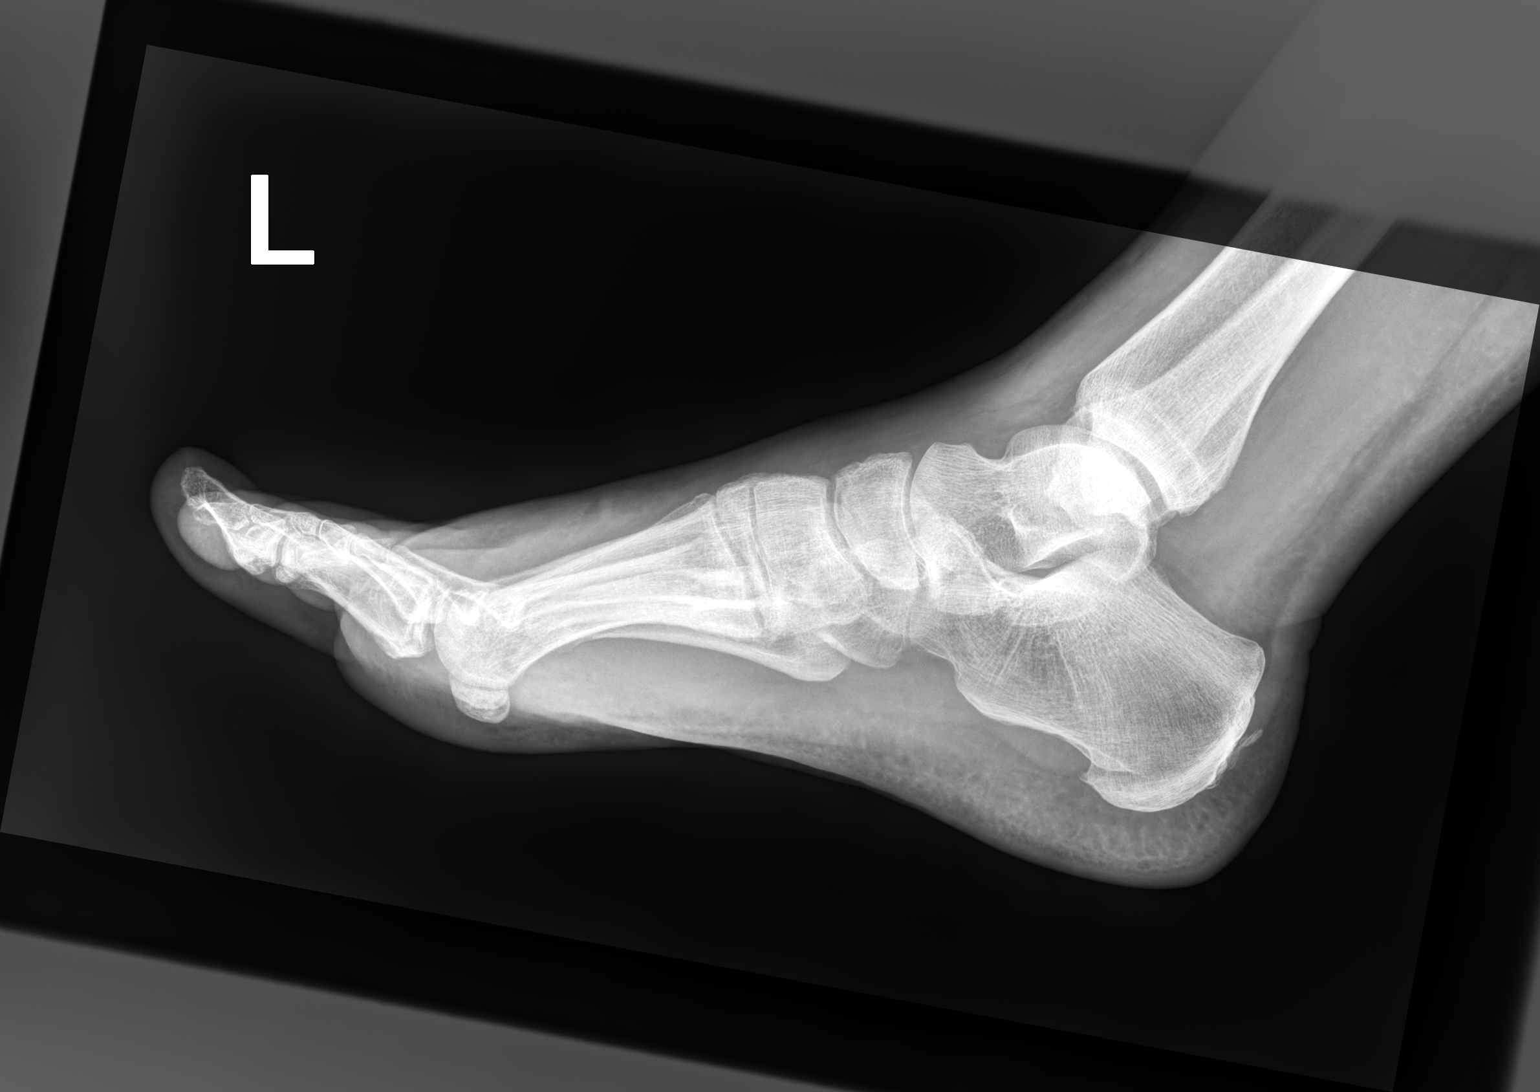

[3 of 3 positions shown; findings below may reference images not displayed]

FINDINGS: Osteopenia No acute fracture or dislocation. Mild degenerative
changes of the first MTP. Tiny enthesophyte of the Achilles tendon.
No area of erosion or osseous destruction. No unexpected radiopaque
foreign body. Soft tissues are unremarkable.
IMPRESSION: No acute osseous abnormality in the left foot.

## 2022-03-29 ENCOUNTER — Other Ambulatory Visit: Payer: Self-pay | Admitting: Gastroenterology

## 2022-03-29 DIAGNOSIS — K219 Gastro-esophageal reflux disease without esophagitis: Secondary | ICD-10-CM

## 2022-04-04 ENCOUNTER — Telehealth: Payer: Self-pay

## 2022-04-04 ENCOUNTER — Telehealth (HOSPITAL_COMMUNITY): Payer: Self-pay

## 2022-04-04 ENCOUNTER — Other Ambulatory Visit (HOSPITAL_COMMUNITY): Payer: Self-pay

## 2022-04-04 NOTE — Telephone Encounter (Deleted)
Pharmacy Patient Advocate Encounter  Prior Authorization for OMEPRAZOLE 40 MG CAP has been approved.    PA# [id number]  *Effective dates:  through   Received notification from [facility] that prior authorization for OMEPRAZOLE 40 MG CAP is needed.    PA submitted on 04/04/22 Key [id number] Status is pending  Karie Soda, [place] Patient Advocate Specialist Direct Number: [phone] Fax: [phone]

## 2022-04-04 NOTE — Telephone Encounter (Addendum)
Pharmacy Patient Advocate Encounter   Prior Authorization for OMEPRAZOLE 40 MG CAP has been approved.     PA# [id number]   *Effective dates:  through ([place])   Received notification from [facility] that prior authorization for OMEPRAZOLE 40 MG CAP is needed.    PA submitted on 04/04/22 Key [id number] Status is pending   Karie Soda, [place] Patient Advocate Specialist Direct Number: [phone] Fax: [phone]

## 2022-04-04 NOTE — Telephone Encounter (Signed)
ERROR

## 2022-04-05 ENCOUNTER — Other Ambulatory Visit (HOSPITAL_COMMUNITY): Payer: Self-pay

## 2022-04-05 NOTE — Telephone Encounter (Signed)
Medication ''omerprazole'' has approval dates of 04/04/22 to 04/03/2025.

## 2022-04-06 ENCOUNTER — Other Ambulatory Visit (HOSPITAL_COMMUNITY): Payer: Self-pay

## 2022-07-12 ENCOUNTER — Encounter: Payer: Self-pay | Admitting: Gastroenterology

## 2022-07-19 IMAGING — DX DG CHEST 2V
2 series · 3 of 3 positions shown · non-contrast
Comparison: Chest radiograph 03/12/2018

CLINICAL DATA: Cough, LEFT mid to lateral anterior rib pain with
inspiration for 1 month

EXAM:
CHEST - 2 VIEW

[Series 1: chest pa · 0.14mm/px · 2 of 2 slices shown]
[im 1/2]
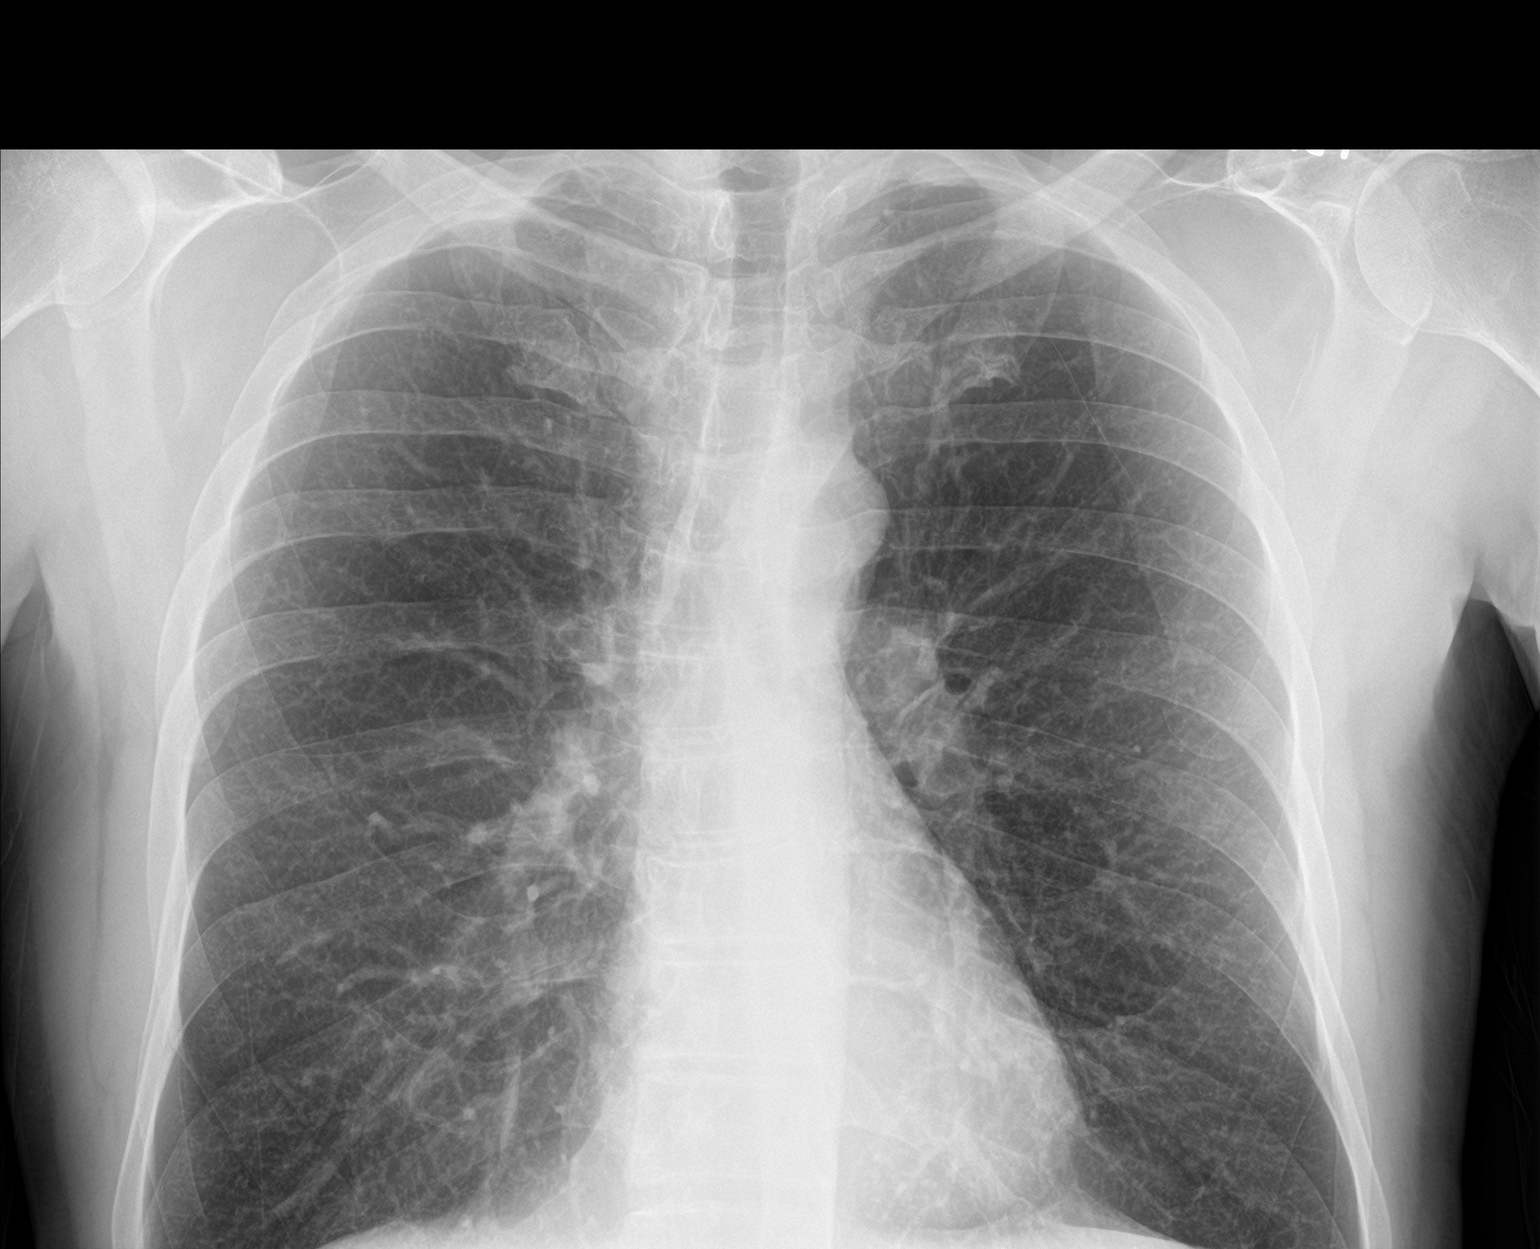
[im 2/2]
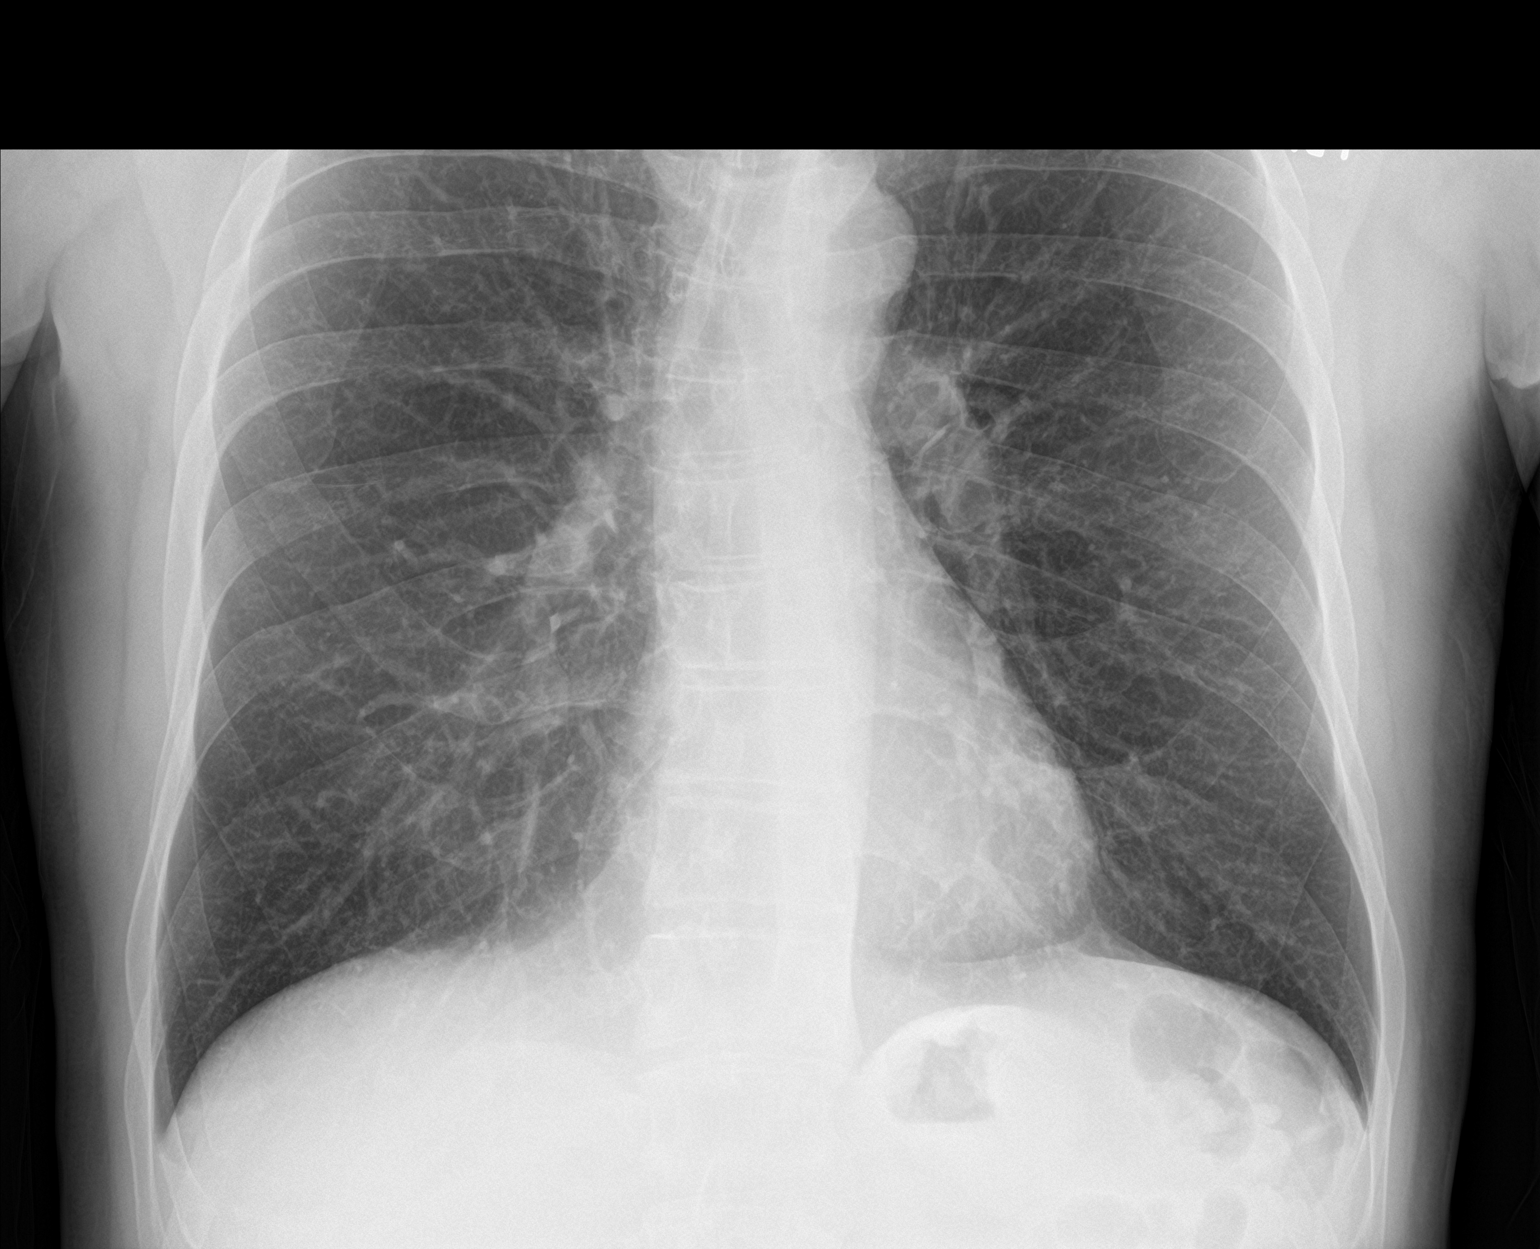

[chest lat]
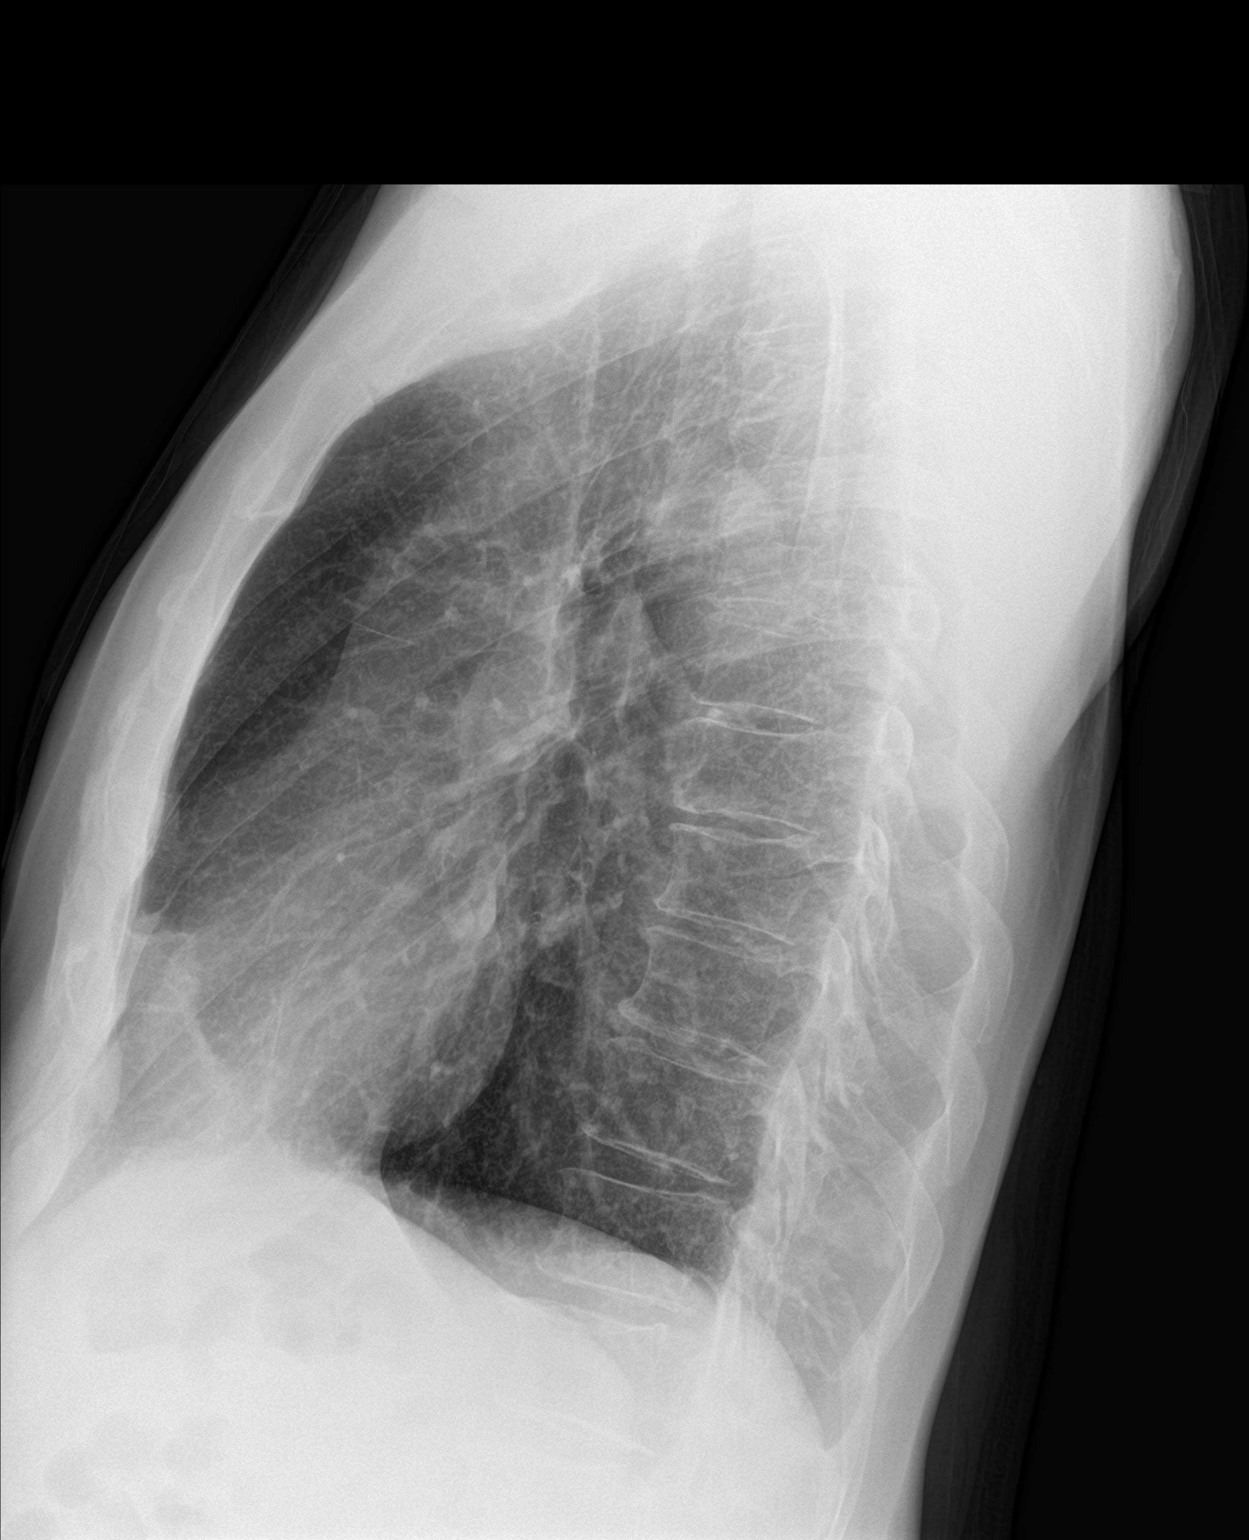

[3 of 3 positions shown; findings below may reference images not displayed]

FINDINGS: Normal heart size, mediastinal contours, and pulmonary vascularity.

Lungs appear emphysematous but clear.

No pulmonary infiltrate, pleural effusion, or pneumothorax.

Prior cervical spine fusion.
IMPRESSION: Suspected COPD changes without acute infiltrate.

## 2022-07-19 IMAGING — DX DG RIBS 2V*L*
3 series · 3 of 3 positions shown · non-contrast
Comparison: None

CLINICAL DATA: LEFT rib pain

EXAM:
LEFT RIBS - 2 VIEW

[rib pa]
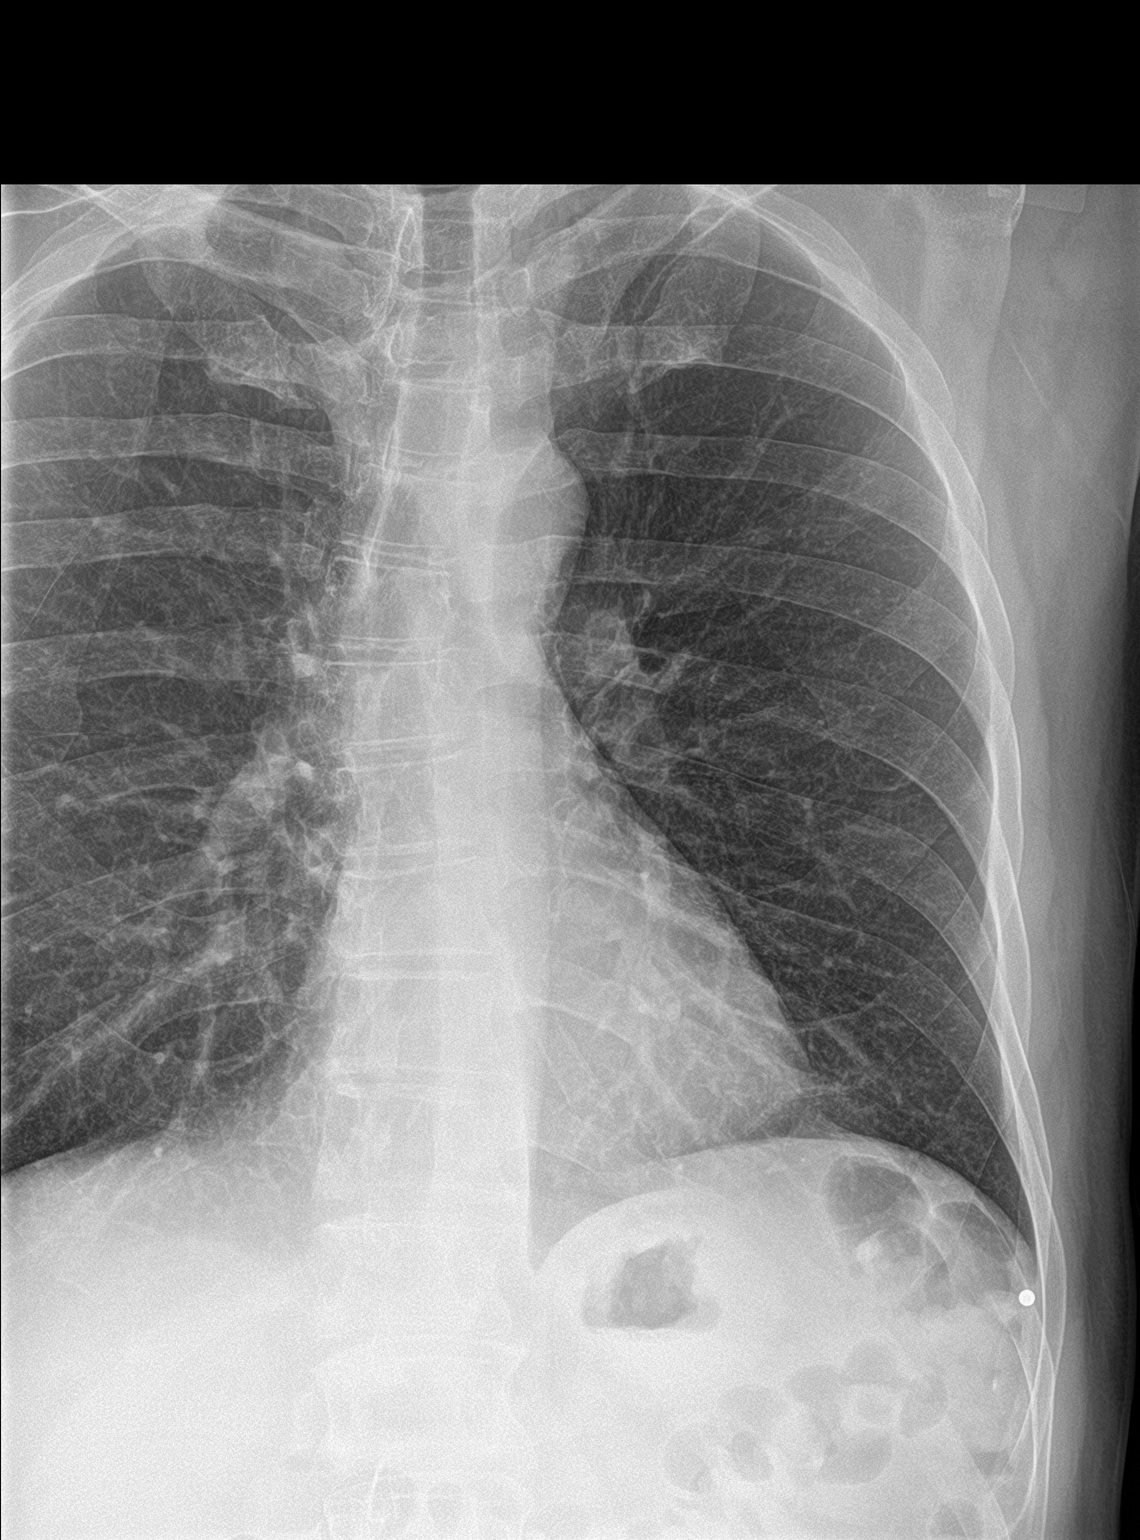

[rib obl (1 of 2)]
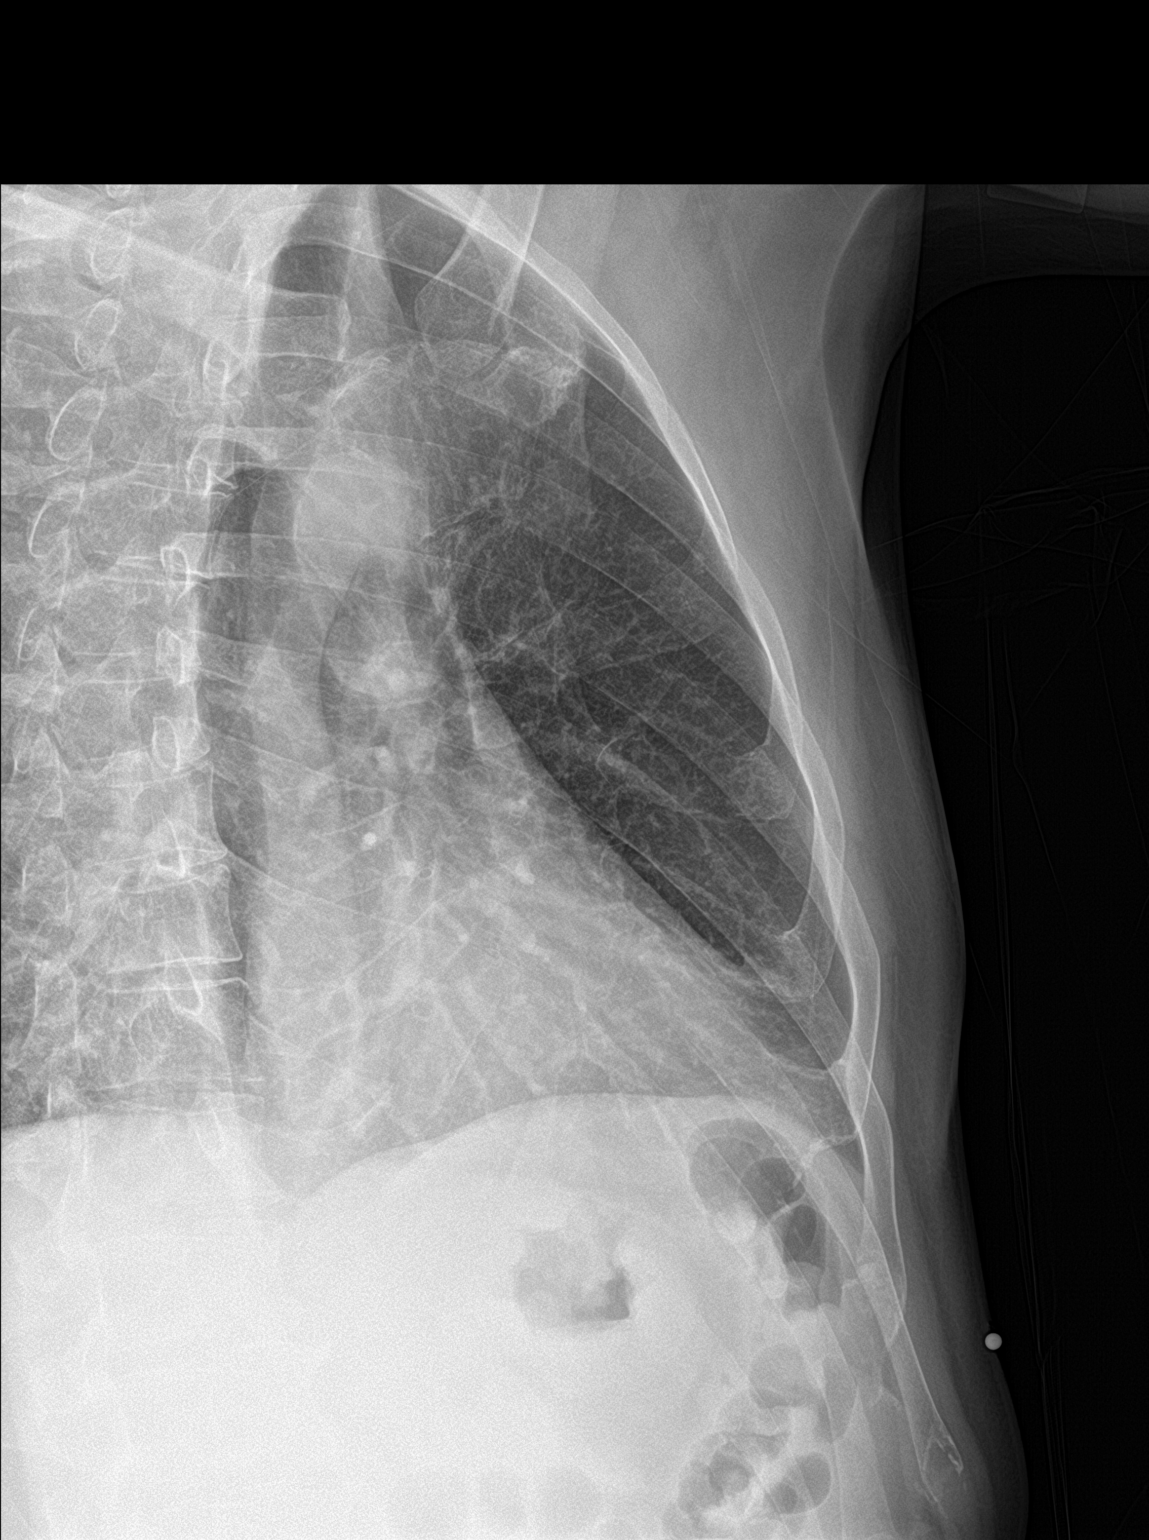

[rib obl (2 of 2)]
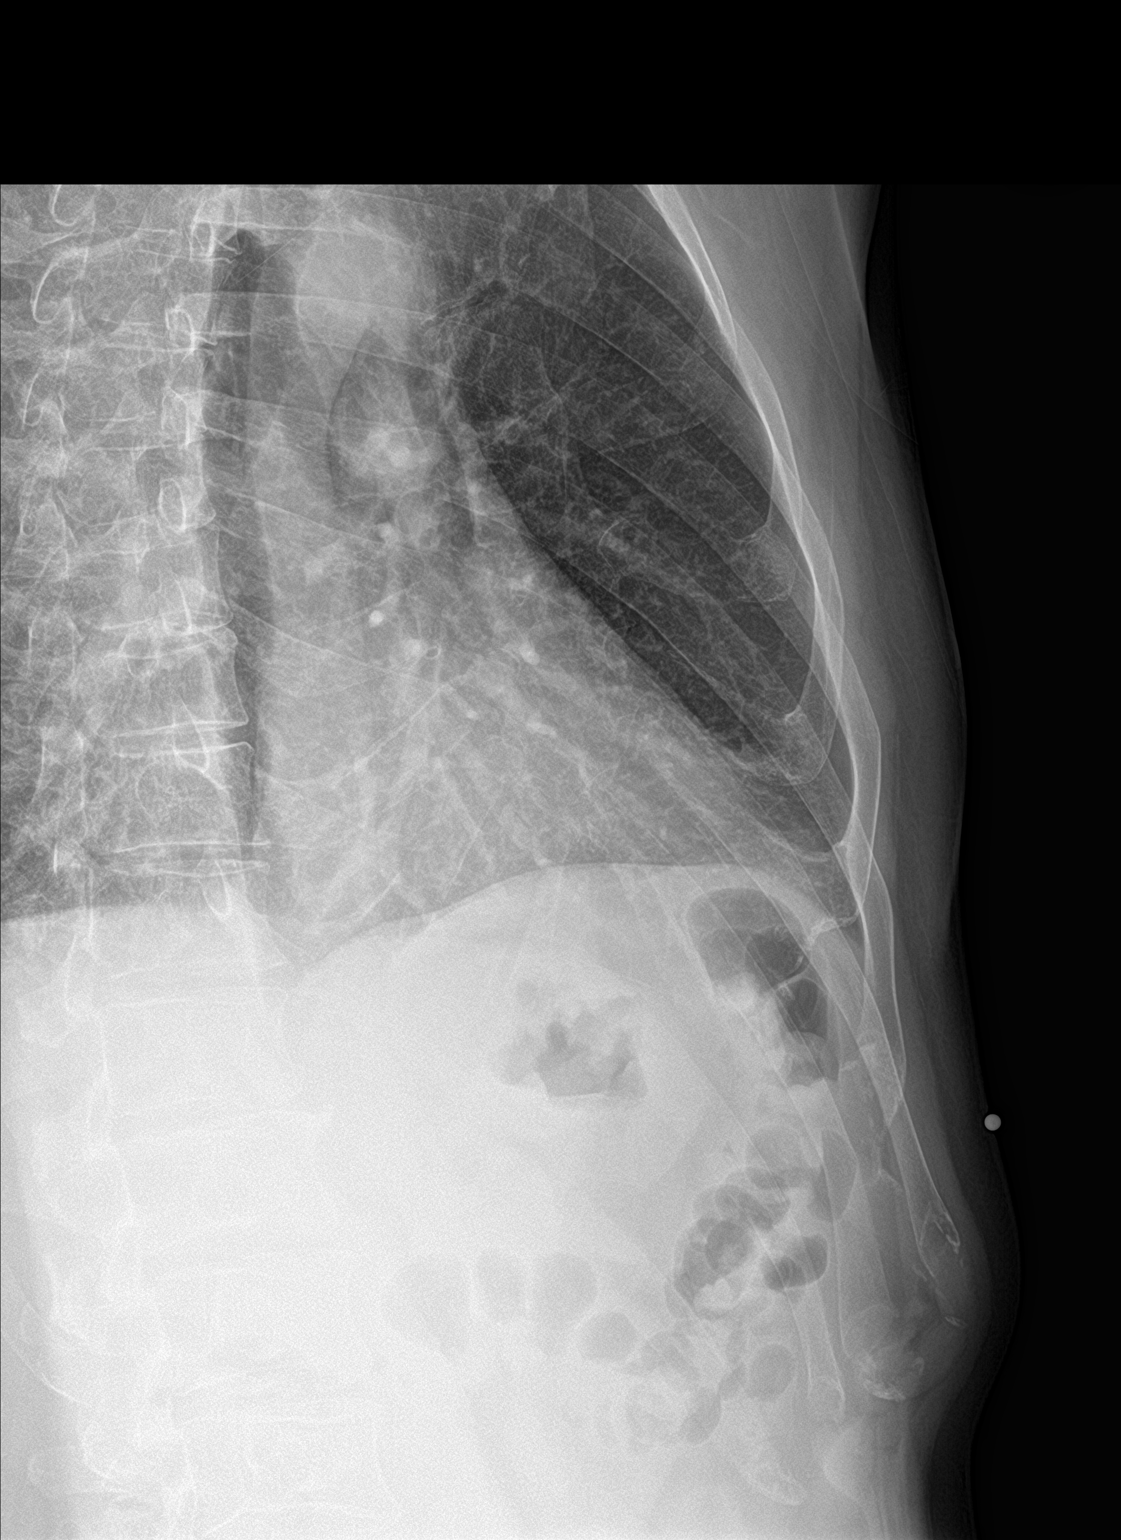

[3 of 3 positions shown; findings below may reference images not displayed]

FINDINGS: BB placed at site of symptoms lower LEFT ribs.

Slight osseous demineralization.

No definite rib fracture or bone destruction.
IMPRESSION: No acute rib abnormalities.

## 2022-11-05 ENCOUNTER — Other Ambulatory Visit: Payer: Self-pay | Admitting: Gastroenterology

## 2022-11-05 DIAGNOSIS — K219 Gastro-esophageal reflux disease without esophagitis: Secondary | ICD-10-CM
